# Patient Record
Sex: Female | Born: 1960 | Race: White | Hispanic: No | Marital: Married | Smoking: Former smoker
Health system: Southern US, Community
[De-identification: ages and names within clinical notes are randomized; demographics above are authoritative.]

## PROBLEM LIST (undated history)

## (undated) HISTORY — PX: CHOLECYSTECTOMY: SHX55

## (undated) HISTORY — PX: BREAST CYST EXCISION: SHX579

## (undated) HISTORY — DX: Hemochromatosis, unspecified: E83.119

## (undated) HISTORY — PX: TUBAL LIGATION: SHX77

## (undated) HISTORY — PX: ABDOMINAL HYSTERECTOMY: SHX81

---

## 2000-05-17 ENCOUNTER — Ambulatory Visit (HOSPITAL_COMMUNITY): Admission: RE | Admit: 2000-05-17 | Discharge: 2000-05-17 | Payer: Self-pay | Admitting: Gynecology

## 2000-05-17 ENCOUNTER — Encounter: Payer: Self-pay | Admitting: Gynecology

## 2001-05-02 ENCOUNTER — Other Ambulatory Visit: Admission: RE | Admit: 2001-05-02 | Discharge: 2001-05-02 | Payer: Self-pay | Admitting: Gynecology

## 2001-05-29 ENCOUNTER — Encounter: Payer: Self-pay | Admitting: Gynecology

## 2001-06-05 ENCOUNTER — Inpatient Hospital Stay (HOSPITAL_COMMUNITY): Admission: RE | Admit: 2001-06-05 | Discharge: 2001-06-07 | Payer: Self-pay | Admitting: Gynecology

## 2001-06-05 ENCOUNTER — Encounter (INDEPENDENT_AMBULATORY_CARE_PROVIDER_SITE_OTHER): Payer: Self-pay

## 2002-05-05 ENCOUNTER — Other Ambulatory Visit: Admission: RE | Admit: 2002-05-05 | Discharge: 2002-05-05 | Payer: Self-pay | Admitting: Gynecology

## 2003-05-06 ENCOUNTER — Encounter: Admission: RE | Admit: 2003-05-06 | Discharge: 2003-05-06 | Payer: Self-pay | Admitting: Gynecology

## 2003-05-06 ENCOUNTER — Encounter: Payer: Self-pay | Admitting: Gynecology

## 2003-05-11 ENCOUNTER — Other Ambulatory Visit: Admission: RE | Admit: 2003-05-11 | Discharge: 2003-05-11 | Payer: Self-pay | Admitting: Gynecology

## 2004-05-08 ENCOUNTER — Encounter: Admission: RE | Admit: 2004-05-08 | Discharge: 2004-05-08 | Payer: Self-pay | Admitting: Gynecology

## 2004-05-15 ENCOUNTER — Other Ambulatory Visit: Admission: RE | Admit: 2004-05-15 | Discharge: 2004-05-15 | Payer: Self-pay | Admitting: Gynecology

## 2005-05-15 ENCOUNTER — Encounter: Admission: RE | Admit: 2005-05-15 | Discharge: 2005-05-15 | Payer: Self-pay | Admitting: Gynecology

## 2005-05-22 ENCOUNTER — Other Ambulatory Visit: Admission: RE | Admit: 2005-05-22 | Discharge: 2005-05-22 | Payer: Self-pay | Admitting: Gynecology

## 2006-05-16 ENCOUNTER — Encounter: Admission: RE | Admit: 2006-05-16 | Discharge: 2006-05-16 | Payer: Self-pay | Admitting: Gynecology

## 2006-05-27 ENCOUNTER — Other Ambulatory Visit: Admission: RE | Admit: 2006-05-27 | Discharge: 2006-05-27 | Payer: Self-pay | Admitting: Gynecology

## 2007-05-22 ENCOUNTER — Encounter: Admission: RE | Admit: 2007-05-22 | Discharge: 2007-05-22 | Payer: Self-pay | Admitting: Gynecology

## 2007-06-02 ENCOUNTER — Other Ambulatory Visit: Admission: RE | Admit: 2007-06-02 | Discharge: 2007-06-02 | Payer: Self-pay | Admitting: Gynecology

## 2008-05-25 ENCOUNTER — Encounter: Admission: RE | Admit: 2008-05-25 | Discharge: 2008-05-25 | Payer: Self-pay | Admitting: Gynecology

## 2009-05-27 ENCOUNTER — Encounter: Admission: RE | Admit: 2009-05-27 | Discharge: 2009-05-27 | Payer: Self-pay | Admitting: Gynecology

## 2010-05-29 ENCOUNTER — Encounter: Admission: RE | Admit: 2010-05-29 | Discharge: 2010-05-29 | Payer: Self-pay | Admitting: Gynecology

## 2010-10-09 ENCOUNTER — Encounter: Payer: Self-pay | Admitting: Gynecology

## 2011-02-02 NOTE — Discharge Summary (Signed)
[facility]  Patient:    Sabrina Ho, Sabrina Ho Visit Number: [id number] MRN: [id number]          Service Type: Attending:  Gretta Cool, M.D. Dictated by:   Jeani Sow, F.N.P. Adm. Date:  06/05/01 Disc. Date: 06/07/01   CC:         [facility]   Discharge Summary  HISTORY OF PRESENT ILLNESS:  [name] is a 50 year old, white, married female, G2, P2 who had a cesarean section with her first child and a subsequent vaginal delivery with difficult manipulation of that delivery.  She has severe pelvic support problems since that time with evidence of levator plate injury bilaterally with defecatory dysfunction and an enormous enterocele and rectocele.  It is also noted that she has moderate cystocele and minor stress urinary incontinence.  It appears that her most significant problem is posterior vaginal wall injury.  She has uterine descensus to the introitus with straining and discomfort with intercourse.  She is now admitted for vaginal hysterectomy, vaginal vault suspension, posterior and enterocele repairs.  PHYSICAL EXAMINATION:  CHEST:  Clear to A&P.  HEART:  Rate and rhythm regular without murmur, gallop or cardiac enlargement.  ABDOMEN:  Soft and scaphoid without masses or organomegaly.  PELVIC:  External genitalia within normal limits for female.  Vagina clean and rugae.  Cervix descends to the introitus with minimal traction.  There is an an enormous transverse perirectal fascial defect with detachment of the fascia from the cervix and separation all the way down to the lower third of the vagina.  There is noted an enormous enterocele and rectocele.  She has bilateral levator plate injury and severe detachment.  The anal sphincter remains reasonably intact.  Perineal body is hypermobile.  Rectovaginal exam confirms.  IMPRESSION: 1. Severe pelvic support problems with grade 3 rectocele, enterocele and    grade 3 uterine  prolapse. 2. Smoker with one pack a day. 3. History of cesarean section. 4. History of vaginal birth after cesarean section.  PLAN:  Vaginal hysterectomy, posterior enterocele repairs, cardinal-uterosacral colposuspension under general anesthesia.  LABORATORY DATA AND X-RAY FINDINGS:  Admission hemoglobin 14.9, hematocrit 42.2.  Chest x-ray with no active disease.  HOSPITAL COURSE:  The patient underwent the above-named procedures without any complications and was returned to the recovery room in excellent condition. Her postoperative course was without complications and she was discharged on postop day #2, in excellent condition.  ACTIVITY:  No heavy lifting or straining.  No vaginal entrance.  Increase ambulation as tolerated.  SPECIAL INSTRUCTIONS:  She is to call for any fever of over 100.5 or failure of daily improvement.  DIET:  Regular.  DISCHARGE MEDICATIONS: 1. Vioxx 25 mg one p.o. daily. 2. Tylox one p.o. q.2-4h. p.r.n. discomfort.  FOLLOWUP:  She is to return to the office in one week for followup.  CONDITION ON DISCHARGE:  Excellent.  DISCHARGE DIAGNOSES: 1. Pelvic organ prolapse with a grade 3 uterine prolapse, rectocele and    enterocele. 2. Defecatory dysfunction secondary to rectocele and enterocele.  PROCEDURES: 1. Vaginal hysterectomy. 2. Postoperative colporrhaphy. 3. Enterocele repair. 4. Cardinal-uterosacral colposuspension. 5. Suprapubic cystocath. Dictated by:   Jeani Sow, F.N.P. Attending:  Gretta Cool, M.D. DD:  07/07/01 TD:  07/08/01 Job: [id number]

## 2011-02-02 NOTE — Op Note (Signed)
[facility]  Patient:    Sabrina Ho, Sabrina Ho                         MRN: [id number] Attending:  Gretta Cool, M.D.                           Operative Report  PREOPERATIVE DIAGNOSIS:  Desires sterilization.  POSTOPERATIVE DIAGNOSIS:  Desires sterilization.  PROCEDURE:  Filshie clip sterilization.  SURGEON:  Gretta Cool, M.D.  ANESTHESIA:  MAC.  DESCRIPTION OF PROCEDURE:  Under excellent IV sedation with local infiltration with Marcaine 0.5%, a subumbilical incision was made and extended into the subcutaneous tissue.  The Veress needle was then introduced through the peritoneum and into the peritoneal cavity.  Nitrous oxide pneumoperitoneum was then obtained.  The pelvis was then surveyed.  There were no abnormalities identified in the abdomen, abdominal cavity, and the pelvis or of pelvic organs.  Fallopian tubes were traced to the fimbriated end.  They were then grasped with the Filshie clip applier and Filshie clips applied to each tube approximately a centimeter lateral to the uterine insertion in the thinnest segment of fallopian tube.  Careful examination of the application site revealed transluminal occlusion.  At this point, the instruments were removed, gas allowed to escape, and the incision closed with deep suture of 5-0 Vicryl and skin closure of Steri-Strips.  At the end of the procedure, sponge and lap counts were correct and no complications.  The patient returned to the recovery room in excellent condition. DD:  05/17/00 TD:  05/17/00 Job: [id number]

## 2011-02-02 NOTE — Op Note (Signed)
[facility]  Patient:    Sabrina Ho, Sabrina Ho Visit Number: [id number] MRN: [id number]          Service Type: Attending:  Gretta Cool, M.D. Dictated by:   Gretta Cool, M.D. Proc. Date: 06/05/01   CC:         [facility]   Operative Report  PREOPERATIVE DIAGNOSES: 1. Pelvic organ prolapse with grade 3 uterine prolapse, rectocele, and    enterocele. 2. Defecatory dysfunction secondary to rectocele and enterocele.  POSTOPERATIVE DIAGNOSES: 1. Pelvic organ prolapse with grade 3 uterine prolapse, rectocele, and    enterocele. 2. Defecatory dysfunction secondary to rectocele and enterocele.  PROCEDURE:  Vaginal hysterectomy, posterior colporrhaphy, enterocele repair, uterosacral cardinal colposuspension, suprapubic cystocath.  SURGEON:  Gretta Cool, M.D.  ANESTHESIA:  General orotracheal.  ASSISTANT:  Raynald Kemp, M.D.  DESCRIPTION OF PROCEDURE:  Under excellent general anesthesia with the patients abdomen prepped and draped as a sterile field and Allen stirrups, the cervix was grasped with single tooth tenaculum and pulled down through the introitus.  The cervical mucosa was then injected with epinephrine, Xylocaine 1% with 100,000-200,000 epinephrine.  The cervical mucosa was then incised and pushed off the lower uterine segment.  Cul-de-sac was then entered and the self-retaining retractor placed in the cul-de-sac for retraction.  At this point the uterosacral, then cardinal ligaments were progressively clamped, cut, sutured, and tied with 0 Vicryl.  The anterior vesical vaginal plica was then opened and indeed replaced beneath the bladder.  The uterine vessels were then clamped, cut, and sutured, and tied with 0 Vicryl.  The uterus was then inverted and the adnexal pedicles clamped, cut, sutured, and tied with 0 Vicryl.  They were then doubly ligated with a free tie of 0 Vicryl.  The peritoneum was then closed with  a running suture of 0 Monocryl.  A large portion of the enterocele sac was then excised so as to eliminate the enterocele.  The uterosacral ligaments were then plicated to as high as possible, secured to the posterior and anterior vaginal fascia and then to the uterosacral and cardinal ligaments so as to suspend the vaginal cuff angled to the cardinal and uterosacral complex as high as possible.  The 0 Ethibond suture was then tied on each side.  The cuff was then closed laterally with careful attention to close complete an envelope of fascia from anterior vaginal wall to posterior vaginal wall and secure it to the uterosacral cardinal complex.  At this point the attention was turned to the posterior vaginal wall repair.  The mucosa was then incised at the introitus and undermined to the apex of the vaginal cuff.  The mucosa was then reflected from the perirectal fascia.  The huge transverse fascial defect was then identified.  The fascia had separated from the cervix and had descended to the lower third of the vagina.  The fascia was mobilized and then secured by multiple interrupted sutures to the anterior vaginal wall and to the uterosacral cardinal complex so as to complete an envelope of fascia that was sufficient to prevent repeat enterocele formation.  The severe detachment was down both lateral pelvic walls and was then corrected with a running suture of #0 Vicryl.  The mucosa was then trimmed and closed as a subcuticular closure including the upper layers of endopelvic fascia from the apex of the cuff to the introitus.  Perineal body musculature was then reapproximated with interrupted sutures and the skin closed by  subcuticular closure.  At the end of the procedure sponge and lap counts were correct.  There were no complications.  At this point the bladder was filled with lactated ringers and approximately 400 cc, and a banana suprapubic cystic cath was placed.  It was secured  with 0 Ethibond.  At the end of the procedure sponge and lap counts were correct and there were no complications.  The patient returned to the recovery room in excellent condition. Dictated by:   Gretta Cool, M.D. Attending:  Gretta Cool, M.D. DD:  06/05/01 TD:  06/05/01 Job: [id number]

## 2011-02-02 NOTE — H&P (Signed)
[facility]  Patient:    Sabrina Ho, Sabrina Ho Visit Number: [id number] MRN: [id number]          Service Type: GYN Location: 4W [id number] Attending Physician:  Katrina Stack Dictated by:   Gretta Cool, M.D. Admit Date:  06/05/2001   CC:         [facility]   History and Physical  CHIEF COMPLAINT:  Pelvic support problems.  HISTORY OF PRESENT ILLNESS:  [age]-year-old white married G2, P2 with history of cesarean section delivery with her first child in 1991, then a vaginal birth after a C-section with her second by another physician with the extremely difficult manipulated delivery.  She has had severe pelvic support problems since that time with evidence of levator plate injury bilaterally with defecatory dysfunction with enormous enterocele and rectocele; she has moderate cystocele as well and minor stress incontinence but her most significant problem is posterior vaginal wall injury.  She has uterine descensus to the introitus with straining and more discomfort with intercourse.  She is now admitted for vaginal hysterectomy, vaginal vault suspension, posterior and enterocele repairs.  She understands the risks and benefits of the procedure, the anticipated recovery, pain management, etc.  I have answered her questions regarding the procedure itself, recovery, sexuality afterwards, etc.  PAST MEDICAL HISTORY:  Usual childhood diseases without sequelae.  Medical illnesses:  None of consequence.  Accidents/injuries:  None.  ALLERGIES:  None known.  PRESENT MEDICATIONS:  None.  FAMILY HISTORY:  Mother and father both living and well.  No known familial tendency to disease.  HABITS:  Smokes one pack per day.  Denies ethanol.  Denies recreational drugs.  SOCIAL HISTORY:  Patient is an Airline pilot for [employer].  Husband is a Landscape architect for [employer].  Two children, both living at home.  REVIEW OF SYSTEMS:   HEENT:  Denies symptoms.  CARDIORESPIRATORY:  Denies asthma, cough, bronchitis or shortness of breath.  GI/GU:  Denies frequency, urgency, dysuria or change in bowel habits, food intolerance.  PHYSICAL EXAMINATION:  GENERAL:  Well-developed, well-nourished white female.  HEENT:  Pupils equal, react to light and accommodate.  Fundi benign. Oropharynx clear.  NECK:  Supple without masses or thyroid enlargement.  CHEST:  Clear P-to-A.  HEART:  Regular rhythm, without murmur or cardiac enlargement.  BREASTS:  Without mass, nodes, nipple discharge.  ABDOMEN:  Soft, scaphoid, without mass or organomegaly.  PELVIC:  External genitalia:  Normal female.  Vagina clean, rugous.  Cervix descends to the introitus with minimal traction.  She has an enormous transverse perirectal fascial defect with detachment of the fascia from the cervix and separation all the way to the lower third of the vagina.  She has an enormous enterocele and rectocele.  She has bilateral levator plate injury and severe detachment.  Her anal sphincter remains reasonably intact.  Her perineal body is hypermobile.  Rectovaginal exam confirms.  EXTREMITIES:  Negative.  NEUROLOGIC:  Physiologic.  IMPRESSIONS: 1. Severe pelvic support problems with grade 3 rectocele, enterocele and    grade 3 uterine prolapse. 2. Tobacco use, one pack per day. 3. History of cesarean section. 4. History of vaginal birth after a cesarean section.  PLAN:  Vaginal hysterectomy, posterior and enterocele repairs, cardinal-uterosacral colposuspension. Dictated by:   Gretta Cool, M.D. Attending Physician:  Katrina Stack DD:  06/06/01 TD:  06/06/01 Job: [id number]

## 2011-04-24 ENCOUNTER — Other Ambulatory Visit: Payer: Self-pay | Admitting: Gynecology

## 2011-04-24 DIAGNOSIS — Z1231 Encounter for screening mammogram for malignant neoplasm of breast: Secondary | ICD-10-CM

## 2011-05-31 ENCOUNTER — Ambulatory Visit
Admission: RE | Admit: 2011-05-31 | Discharge: 2011-05-31 | Disposition: A | Discharge status: Home / Self Care | Payer: BC Managed Care – PPO | Source: Ambulatory Visit | Attending: Gynecology | Admitting: Gynecology

## 2011-05-31 ENCOUNTER — Other Ambulatory Visit: Payer: Self-pay | Admitting: Gynecology

## 2011-05-31 DIAGNOSIS — Z1231 Encounter for screening mammogram for malignant neoplasm of breast: Secondary | ICD-10-CM

## 2012-05-02 ENCOUNTER — Other Ambulatory Visit: Payer: Self-pay | Admitting: Gynecology

## 2012-05-02 DIAGNOSIS — Z1231 Encounter for screening mammogram for malignant neoplasm of breast: Secondary | ICD-10-CM

## 2012-06-02 ENCOUNTER — Ambulatory Visit: Payer: BC Managed Care – PPO

## 2012-06-05 ENCOUNTER — Ambulatory Visit
Admission: RE | Admit: 2012-06-05 | Discharge: 2012-06-05 | Disposition: A | Discharge status: Home / Self Care | Payer: BC Managed Care – PPO | Source: Ambulatory Visit | Attending: Gynecology | Admitting: Gynecology

## 2012-06-05 DIAGNOSIS — Z1231 Encounter for screening mammogram for malignant neoplasm of breast: Secondary | ICD-10-CM

## 2012-09-07 ENCOUNTER — Ambulatory Visit (INDEPENDENT_AMBULATORY_CARE_PROVIDER_SITE_OTHER): Payer: BC Managed Care – PPO | Admitting: Family Medicine

## 2012-09-07 VITALS — BP 118/73 | HR 88 | Temp 97.8°F | Resp 18 | Ht 64.0 in | Wt 130.6 lb

## 2012-09-07 DIAGNOSIS — R059 Cough, unspecified: Secondary | ICD-10-CM

## 2012-09-07 DIAGNOSIS — J029 Acute pharyngitis, unspecified: Secondary | ICD-10-CM

## 2012-09-07 DIAGNOSIS — R05 Cough: Secondary | ICD-10-CM

## 2012-09-07 LAB — POCT RAPID STREP A (OFFICE): Rapid Strep A Screen: NEGATIVE

## 2012-09-07 MED ORDER — CEFDINIR 300 MG PO CAPS: 300.0000 mg | ORAL_CAPSULE | Freq: Two times a day (BID) | ORAL | Status: DC

## 2012-09-07 NOTE — Patient Instructions (Signed)

## 2012-09-07 NOTE — Progress Notes (Signed)
Patient ID: Sabrina Ho MRN: [id number], DOB: 1960/09/21, 51 y.o. Date of Encounter: 09/07/2012, 11:49 AM  Primary Physician: No primary provider on file.  Chief Complaint:  Chief Complaint  Patient presents with  . Sore Throat  . Headache    HPI: 51 y.o. year old female presents with 3 day history of sore throat. Subjective fever and chills. No  congestion, rhinorrhea, sinus pressure, otalgia, or headache. Normal hearing. No GI complaints. Able to swallow saliva, but hurts to do so. Decreased appetite secondary to sore throat.  Complains of achiness, too. She notes having a dry cough for about a month. History reviewed. No pertinent past medical history.   Home Meds: Prior to Admission medications   Medication Sig Start Date End Date Taking? Authorizing Provider  Cholecalciferol (VITAMIN D PO) Take 1 capsule by mouth daily.   Yes Historical Provider, MD  Estradiol (MINIVELLE TD) Place 1 patch onto the skin 2 (two) times a week.   Yes Historical Provider, MD    Allergies:  Allergies  Allergen Reactions  . Amoxicillin Rash    History   Social History  . Marital Status: Married    Spouse Name: N/A    Number of Children: N/A  . Years of Education: N/A   Occupational History  . Not on file.   Social History Main Topics  . Smoking status: Never Smoker   . Smokeless tobacco: Not on file  . Alcohol Use: No  . Drug Use: No  . Sexually Active: Yes   Other Topics Concern  . Not on file   Social History Narrative  . No narrative on file     Review of Systems: Constitutional: negative for chills, fever, night sweats or weight changes HEENT: see above Cardiovascular: negative for chest pain or palpitations Respiratory: negative for hemoptysis, wheezing, or shortness of breath Abdominal: negative for abdominal pain, nausea, vomiting or diarrhea Dermatological: negative for rash Neurologic: negative for headache   Physical Exam: Blood pressure 118/73,  pulse 88, temperature 97.8 F (36.6 C), temperature source Oral, resp. rate 18, height 5\' 4"  (1.626 m), weight 130 lb 9.6 oz (59.24 kg), SpO2 98.00%., Body mass index is 22.42 kg/(m^2). General: Well developed, well nourished, in no acute distress. Head: Normocephalic, atraumatic, eyes without discharge, sclera non-icteric, nares are patent. Bilateral auditory canals clear, TM's are without perforation, pearly grey with reflective cone of light bilaterally. No sinus TTP. Oral cavity moist, dentition normal. Posterior pharynx with post nasal drip and mild erythema. No peritonsillar abscess or tonsillar exudate. Neck: Supple. No thyromegaly. Full ROM. No lymphadenopathy. Lungs: Clear bilaterally to auscultation without wheezes, rales, or rhonchi. Breathing is unlabored. Heart: RRR with S1 S2. No murmurs, rubs, or gallops appreciated. Abdomen: Soft, non-tender, non-distended with normoactive bowel sounds. No hepatomegaly. No rebound/guarding. No obvious abdominal masses. Msk:  Strength and tone normal for age. Extremities: No clubbing or cyanosis. No edema. Neuro: Alert and oriented X 3. Moves all extremities spontaneously. CNII-XII grossly in tact. Psych:  Responds to questions appropriately with a normal affect.   Labs: Results for orders placed in visit on 09/07/12  POCT RAPID STREP A (OFFICE)      Component Value Range   Rapid Strep A Screen Negative  Negative     ASSESSMENT AND PLAN:  51 y.o. year old female with sore throat 1. Pharyngitis  POCT rapid strep A, cefdinir (OMNICEF) 300 MG capsule  2. Cough      - -Tylenol/Motrin prn -Rest/fluids -RTC  precautions -RTC 3-5 days if no improvement  Signed, Elvina Sidle, MD 09/07/2012 11:49 AM

## 2013-05-06 ENCOUNTER — Other Ambulatory Visit: Payer: Self-pay

## 2013-05-06 DIAGNOSIS — Z1231 Encounter for screening mammogram for malignant neoplasm of breast: Secondary | ICD-10-CM

## 2013-06-08 ENCOUNTER — Ambulatory Visit
Admission: RE | Admit: 2013-06-08 | Discharge: 2013-06-08 | Disposition: A | Discharge status: Home / Self Care | Payer: BC Managed Care – PPO | Source: Ambulatory Visit

## 2013-06-08 DIAGNOSIS — Z1231 Encounter for screening mammogram for malignant neoplasm of breast: Secondary | ICD-10-CM

## 2014-05-13 ENCOUNTER — Other Ambulatory Visit: Payer: Self-pay

## 2014-05-13 DIAGNOSIS — Z1231 Encounter for screening mammogram for malignant neoplasm of breast: Secondary | ICD-10-CM

## 2014-06-09 ENCOUNTER — Ambulatory Visit
Admission: RE | Admit: 2014-06-09 | Discharge: 2014-06-09 | Disposition: A | Discharge status: Home / Self Care | Payer: BC Managed Care – PPO | Source: Ambulatory Visit

## 2014-06-09 DIAGNOSIS — Z1231 Encounter for screening mammogram for malignant neoplasm of breast: Secondary | ICD-10-CM

## 2014-10-11 LAB — HM COLONOSCOPY

## 2015-05-09 ENCOUNTER — Other Ambulatory Visit: Payer: Self-pay

## 2015-05-09 DIAGNOSIS — Z1231 Encounter for screening mammogram for malignant neoplasm of breast: Secondary | ICD-10-CM

## 2015-06-14 ENCOUNTER — Ambulatory Visit
Admission: RE | Admit: 2015-06-14 | Discharge: 2015-06-14 | Disposition: A | Discharge status: Home / Self Care | Payer: BLUE CROSS/BLUE SHIELD | Source: Ambulatory Visit

## 2015-06-14 DIAGNOSIS — Z1231 Encounter for screening mammogram for malignant neoplasm of breast: Secondary | ICD-10-CM

## 2015-06-17 ENCOUNTER — Other Ambulatory Visit: Payer: Self-pay | Admitting: Gynecology

## 2015-06-17 DIAGNOSIS — R928 Other abnormal and inconclusive findings on diagnostic imaging of breast: Secondary | ICD-10-CM

## 2015-06-27 ENCOUNTER — Ambulatory Visit
Admission: RE | Admit: 2015-06-27 | Discharge: 2015-06-27 | Disposition: A | Discharge status: Home / Self Care | Payer: BLUE CROSS/BLUE SHIELD | Source: Ambulatory Visit | Attending: Gynecology | Admitting: Gynecology

## 2015-06-27 ENCOUNTER — Other Ambulatory Visit: Payer: Self-pay | Admitting: Gynecology

## 2015-06-27 DIAGNOSIS — N6320 Unspecified lump in the left breast, unspecified quadrant: Secondary | ICD-10-CM

## 2015-06-27 DIAGNOSIS — R928 Other abnormal and inconclusive findings on diagnostic imaging of breast: Secondary | ICD-10-CM

## 2015-07-04 ENCOUNTER — Other Ambulatory Visit: Payer: BLUE CROSS/BLUE SHIELD

## 2015-07-05 ENCOUNTER — Other Ambulatory Visit: Payer: Self-pay | Admitting: Gynecology

## 2015-07-05 ENCOUNTER — Ambulatory Visit
Admission: RE | Admit: 2015-07-05 | Discharge: 2015-07-05 | Disposition: A | Discharge status: Home / Self Care | Payer: BLUE CROSS/BLUE SHIELD | Source: Ambulatory Visit | Attending: Gynecology | Admitting: Gynecology

## 2015-07-05 DIAGNOSIS — N6320 Unspecified lump in the left breast, unspecified quadrant: Secondary | ICD-10-CM

## 2015-07-06 ENCOUNTER — Other Ambulatory Visit: Payer: Self-pay | Admitting: Gynecology

## 2015-07-06 ENCOUNTER — Other Ambulatory Visit: Payer: Self-pay | Admitting: General Surgery

## 2015-07-06 DIAGNOSIS — R928 Other abnormal and inconclusive findings on diagnostic imaging of breast: Secondary | ICD-10-CM

## 2015-07-19 ENCOUNTER — Ambulatory Visit
Admission: RE | Admit: 2015-07-19 | Discharge: 2015-07-19 | Disposition: A | Discharge status: Home / Self Care | Payer: BLUE CROSS/BLUE SHIELD | Source: Ambulatory Visit | Attending: Gynecology | Admitting: Gynecology

## 2015-07-19 DIAGNOSIS — R928 Other abnormal and inconclusive findings on diagnostic imaging of breast: Secondary | ICD-10-CM

## 2015-08-01 ENCOUNTER — Other Ambulatory Visit: Payer: Self-pay | Admitting: General Surgery

## 2015-08-01 DIAGNOSIS — R928 Other abnormal and inconclusive findings on diagnostic imaging of breast: Secondary | ICD-10-CM

## 2015-08-02 ENCOUNTER — Other Ambulatory Visit: Payer: Self-pay | Admitting: General Surgery

## 2015-08-02 DIAGNOSIS — D242 Benign neoplasm of left breast: Secondary | ICD-10-CM

## 2015-08-03 ENCOUNTER — Encounter (HOSPITAL_BASED_OUTPATIENT_CLINIC_OR_DEPARTMENT_OTHER): Payer: Self-pay | Admitting: *Deleted

## 2015-08-10 ENCOUNTER — Ambulatory Visit
Admission: RE | Admit: 2015-08-10 | Discharge: 2015-08-10 | Disposition: A | Discharge status: Home / Self Care | Payer: BLUE CROSS/BLUE SHIELD | Source: Ambulatory Visit | Attending: General Surgery | Admitting: General Surgery

## 2015-08-10 DIAGNOSIS — R928 Other abnormal and inconclusive findings on diagnostic imaging of breast: Secondary | ICD-10-CM

## 2015-08-15 ENCOUNTER — Ambulatory Visit
Admission: RE | Admit: 2015-08-15 | Discharge: 2015-08-15 | Disposition: A | Discharge status: Home / Self Care | Payer: BLUE CROSS/BLUE SHIELD | Source: Ambulatory Visit | Attending: General Surgery | Admitting: General Surgery

## 2015-08-15 ENCOUNTER — Encounter (HOSPITAL_BASED_OUTPATIENT_CLINIC_OR_DEPARTMENT_OTHER): Payer: Self-pay | Admitting: Certified Registered"

## 2015-08-15 ENCOUNTER — Ambulatory Visit (HOSPITAL_BASED_OUTPATIENT_CLINIC_OR_DEPARTMENT_OTHER): Payer: BLUE CROSS/BLUE SHIELD | Admitting: Certified Registered"

## 2015-08-15 ENCOUNTER — Encounter (HOSPITAL_BASED_OUTPATIENT_CLINIC_OR_DEPARTMENT_OTHER)
Admission: RE | Disposition: A | Discharge status: Home / Self Care | Payer: Self-pay | Source: Ambulatory Visit | Attending: General Surgery

## 2015-08-15 ENCOUNTER — Ambulatory Visit (HOSPITAL_BASED_OUTPATIENT_CLINIC_OR_DEPARTMENT_OTHER)
Admission: RE | Admit: 2015-08-15 | Discharge: 2015-08-15 | Disposition: A | Discharge status: Home / Self Care | Payer: BLUE CROSS/BLUE SHIELD | Source: Ambulatory Visit | Attending: General Surgery | Admitting: General Surgery

## 2015-08-15 DIAGNOSIS — Z87891 Personal history of nicotine dependence: Secondary | ICD-10-CM | POA: Diagnosis not present

## 2015-08-15 DIAGNOSIS — Z9049 Acquired absence of other specified parts of digestive tract: Secondary | ICD-10-CM | POA: Diagnosis not present

## 2015-08-15 DIAGNOSIS — R928 Other abnormal and inconclusive findings on diagnostic imaging of breast: Secondary | ICD-10-CM

## 2015-08-15 DIAGNOSIS — N62 Hypertrophy of breast: Secondary | ICD-10-CM | POA: Insufficient documentation

## 2015-08-15 DIAGNOSIS — N6092 Unspecified benign mammary dysplasia of left breast: Secondary | ICD-10-CM | POA: Diagnosis not present

## 2015-08-15 DIAGNOSIS — Z881 Allergy status to other antibiotic agents status: Secondary | ICD-10-CM | POA: Insufficient documentation

## 2015-08-15 DIAGNOSIS — D242 Benign neoplasm of left breast: Secondary | ICD-10-CM | POA: Diagnosis present

## 2015-08-15 HISTORY — PX: BREAST LUMPECTOMY WITH RADIOACTIVE SEED LOCALIZATION: SHX6424

## 2015-08-15 SURGERY — BREAST LUMPECTOMY WITH RADIOACTIVE SEED LOCALIZATION
Anesthesia: General | Site: Breast | Laterality: Left

## 2015-08-15 MED ORDER — MIDAZOLAM HCL 2 MG/2ML IJ SOLN
1.0000 mg | INTRAMUSCULAR | Status: DC | PRN
  Administered 2015-08-15: 2 mg via INTRAVENOUS

## 2015-08-15 MED ORDER — MIDAZOLAM HCL 2 MG/2ML IJ SOLN
INTRAMUSCULAR | Status: AC
  Filled 2015-08-15: qty 2

## 2015-08-15 MED ORDER — OXYCODONE-ACETAMINOPHEN 5-325 MG PO TABS: 1.0000 | ORAL_TABLET | ORAL | Status: DC | PRN

## 2015-08-15 MED ORDER — GLYCOPYRROLATE 0.2 MG/ML IJ SOLN: 0.2000 mg | Freq: Once | INTRAMUSCULAR | Status: DC | PRN

## 2015-08-15 MED ORDER — FENTANYL CITRATE (PF) 100 MCG/2ML IJ SOLN
50.0000 ug | INTRAMUSCULAR | Status: AC | PRN
  Administered 2015-08-15: 25 ug via INTRAVENOUS
  Administered 2015-08-15: 50 ug via INTRAVENOUS
  Administered 2015-08-15: 25 ug via INTRAVENOUS

## 2015-08-15 MED ORDER — SCOPOLAMINE 1 MG/3DAYS TD PT72: 1.0000 | MEDICATED_PATCH | Freq: Once | TRANSDERMAL | Status: DC | PRN

## 2015-08-15 MED ORDER — OXYCODONE HCL 5 MG PO TABS
ORAL_TABLET | ORAL | Status: AC
  Filled 2015-08-15: qty 1

## 2015-08-15 MED ORDER — DEXAMETHASONE SODIUM PHOSPHATE 4 MG/ML IJ SOLN
INTRAMUSCULAR | Status: DC | PRN
  Administered 2015-08-15: 10 mg via INTRAVENOUS

## 2015-08-15 MED ORDER — DEXAMETHASONE SODIUM PHOSPHATE 10 MG/ML IJ SOLN
INTRAMUSCULAR | Status: AC
  Filled 2015-08-15: qty 1

## 2015-08-15 MED ORDER — BUPIVACAINE HCL (PF) 0.25 % IJ SOLN
INTRAMUSCULAR | Status: DC | PRN
  Administered 2015-08-15: 16 mL

## 2015-08-15 MED ORDER — VANCOMYCIN HCL IN DEXTROSE 1-5 GM/200ML-% IV SOLN
1000.0000 mg | INTRAVENOUS | Status: AC
  Administered 2015-08-15: 750 mg via INTRAVENOUS
  Administered 2015-08-15: 1000 mg via INTRAVENOUS

## 2015-08-15 MED ORDER — ONDANSETRON HCL 4 MG/2ML IJ SOLN
INTRAMUSCULAR | Status: DC | PRN
  Administered 2015-08-15: 4 mg via INTRAVENOUS

## 2015-08-15 MED ORDER — PROMETHAZINE HCL 25 MG/ML IJ SOLN: 6.2500 mg | INTRAMUSCULAR | Status: DC | PRN

## 2015-08-15 MED ORDER — LIDOCAINE HCL (CARDIAC) 20 MG/ML IV SOLN
INTRAVENOUS | Status: AC
  Filled 2015-08-15: qty 5

## 2015-08-15 MED ORDER — VANCOMYCIN HCL IN DEXTROSE 1-5 GM/200ML-% IV SOLN
INTRAVENOUS | Status: AC
Start: 2015-08-15 — End: 2015-08-15
  Filled 2015-08-15: qty 200

## 2015-08-15 MED ORDER — PROPOFOL 10 MG/ML IV BOLUS
INTRAVENOUS | Status: DC | PRN
  Administered 2015-08-15: 150 mg via INTRAVENOUS

## 2015-08-15 MED ORDER — FENTANYL CITRATE (PF) 100 MCG/2ML IJ SOLN
25.0000 ug | INTRAMUSCULAR | Status: DC | PRN
  Administered 2015-08-15 (×3): 50 ug via INTRAVENOUS

## 2015-08-15 MED ORDER — ONDANSETRON HCL 4 MG/2ML IJ SOLN
INTRAMUSCULAR | Status: AC
  Filled 2015-08-15: qty 2

## 2015-08-15 MED ORDER — CHLORHEXIDINE GLUCONATE 4 % EX LIQD: 1.0000 "application " | Freq: Once | CUTANEOUS | Status: DC

## 2015-08-15 MED ORDER — OXYCODONE HCL 5 MG PO TABS
5.0000 mg | ORAL_TABLET | Freq: Once | ORAL | Status: AC
  Administered 2015-08-15: 5 mg via ORAL

## 2015-08-15 MED ORDER — FENTANYL CITRATE (PF) 100 MCG/2ML IJ SOLN
INTRAMUSCULAR | Status: AC
  Filled 2015-08-15: qty 2

## 2015-08-15 MED ORDER — LACTATED RINGERS IV SOLN
INTRAVENOUS | Status: DC
  Administered 2015-08-15 (×2): via INTRAVENOUS

## 2015-08-15 MED ORDER — LIDOCAINE HCL (CARDIAC) 20 MG/ML IV SOLN
INTRAVENOUS | Status: DC | PRN
  Administered 2015-08-15: 60 mg via INTRAVENOUS

## 2015-08-15 SURGICAL SUPPLY — 42 items
APPLIER CLIP 9.375 MED OPEN (MISCELLANEOUS)
APR CLP MED 9.3 20 MLT OPN (MISCELLANEOUS)
BLADE SURG 15 STRL LF DISP TIS (BLADE) ×1 IMPLANT
BLADE SURG 15 STRL SS (BLADE) ×3
CANISTER SUC SOCK COL 7IN (MISCELLANEOUS) ×1 IMPLANT
CANISTER SUCT 1200ML W/VALVE (MISCELLANEOUS) ×3 IMPLANT
CHLORAPREP W/TINT 26ML (MISCELLANEOUS) ×3 IMPLANT
CLIP APPLIE 9.375 MED OPEN (MISCELLANEOUS) IMPLANT
COVER BACK TABLE 60X90IN (DRAPES) ×3 IMPLANT
COVER MAYO STAND STRL (DRAPES) ×3 IMPLANT
COVER PROBE W GEL 5X96 (DRAPES) ×3 IMPLANT
DECANTER SPIKE VIAL GLASS SM (MISCELLANEOUS) IMPLANT
DEVICE DUBIN W/COMP PLATE 8390 (MISCELLANEOUS) ×3 IMPLANT
DRAPE LAPAROSCOPIC ABDOMINAL (DRAPES) IMPLANT
DRAPE UTILITY XL STRL (DRAPES) ×3 IMPLANT
ELECT COATED BLADE 2.86 ST (ELECTRODE) ×3 IMPLANT
ELECT REM PT RETURN 9FT ADLT (ELECTROSURGICAL) ×3
ELECTRODE REM PT RTRN 9FT ADLT (ELECTROSURGICAL) ×1 IMPLANT
GLOVE BIO SURGEON STRL SZ 6.5 (GLOVE) ×1 IMPLANT
GLOVE BIO SURGEON STRL SZ7.5 (GLOVE) ×6 IMPLANT
GLOVE BIO SURGEONS STRL SZ 6.5 (GLOVE) ×1
GLOVE BIOGEL M 7.0 STRL (GLOVE) ×4 IMPLANT
GOWN STRL REUS W/ TWL LRG LVL3 (GOWN DISPOSABLE) ×2 IMPLANT
GOWN STRL REUS W/TWL LRG LVL3 (GOWN DISPOSABLE) ×6
KIT MARKER MARGIN INK (KITS) ×3 IMPLANT
LIQUID BAND (GAUZE/BANDAGES/DRESSINGS) ×3 IMPLANT
NDL HYPO 25X1 1.5 SAFETY (NEEDLE) IMPLANT
NEEDLE HYPO 25X1 1.5 SAFETY (NEEDLE) ×3 IMPLANT
NS IRRIG 1000ML POUR BTL (IV SOLUTION) IMPLANT
PACK BASIN DAY SURGERY FS (CUSTOM PROCEDURE TRAY) ×3 IMPLANT
PENCIL BUTTON HOLSTER BLD 10FT (ELECTRODE) ×3 IMPLANT
SLEEVE SCD COMPRESS KNEE MED (MISCELLANEOUS) ×3 IMPLANT
SPONGE LAP 18X18 X RAY DECT (DISPOSABLE) ×3 IMPLANT
SUT MON AB 4-0 PC3 18 (SUTURE) IMPLANT
SUT SILK 2 0 SH (SUTURE) IMPLANT
SUT VICRYL 3-0 CR8 SH (SUTURE) ×3 IMPLANT
SYR CONTROL 10ML LL (SYRINGE) IMPLANT
TOWEL OR 17X24 6PK STRL BLUE (TOWEL DISPOSABLE) ×3 IMPLANT
TOWEL OR NON WOVEN STRL DISP B (DISPOSABLE) ×3 IMPLANT
TUBE CONNECTING 20'X1/4 (TUBING)
TUBE CONNECTING 20X1/4 (TUBING) ×1 IMPLANT
YANKAUER SUCT BULB TIP NO VENT (SUCTIONS) IMPLANT

## 2015-08-15 NOTE — Discharge Instructions (Signed)

## 2015-08-15 NOTE — Anesthesia Postprocedure Evaluation (Signed)
Anesthesia Post Note  Patient: Sabrina Ho  Procedure(s) Performed: Procedure(s) (LRB): BREAST LUMPECTOMY WITH RADIOACTIVE SEED LOCALIZATION (Left)  Patient location during evaluation: PACU Anesthesia Type: General Level of consciousness: awake and alert Pain management: pain level controlled Vital Signs Assessment: post-procedure vital signs reviewed and stable Respiratory status: spontaneous breathing, nonlabored ventilation, respiratory function stable and patient connected to nasal cannula oxygen Cardiovascular status: blood pressure returned to baseline and stable Postop Assessment: No signs of nausea or vomiting Anesthetic complications: no    Last Vitals:  Filed Vitals:   08/15/15 1515 08/15/15 1530  BP: 117/63 128/75  Pulse: 69 75  Temp:  36.5 C  Resp: 12 16    Last Pain:  Filed Vitals:   08/15/15 1543  PainSc: 3                  Catalina Gravel

## 2015-08-15 NOTE — Anesthesia Procedure Notes (Signed)
Procedure Name: LMA Insertion Date/Time: 08/15/2015 1:36 PM Performed by: Andria Head D Pre-anesthesia Checklist: Patient identified, Emergency Drugs available, Suction available and Patient being monitored Patient Re-evaluated:Patient Re-evaluated prior to inductionOxygen Delivery Method: Circle System Utilized Preoxygenation: Pre-oxygenation with 100% oxygen Intubation Type: IV induction Ventilation: Mask ventilation without difficulty LMA: LMA inserted LMA Size: 4.0 Number of attempts: 1 Airway Equipment and Method: Bite block Placement Confirmation: positive ETCO2 Tube secured with: Tape Dental Injury: Teeth and Oropharynx as per pre-operative assessment

## 2015-08-15 NOTE — Op Note (Signed)
08/15/2015  2:13 PM  PATIENT:  Sabrina Ho  54 y.o. female  PRE-OPERATIVE DIAGNOSIS:  LEFT BREAST INTRADUCTAL PAPILLOMA  POST-OPERATIVE DIAGNOSIS:  LEFT BREAST INTRADUCTAL PAPILLOMA  PROCEDURE:  Procedure(s): BREAST PARTIAL MASTECTOMY WITH RADIOACTIVE SEED LOCALIZATION (Left)  SURGEON:  Surgeon(s) and Role:    * Jovita Kussmaul, MD - Primary  PHYSICIAN ASSISTANT:   ASSISTANTS: none   ANESTHESIA:   general  EBL:  Total I/O In: 1100 [I.V.:1100] Out: -   BLOOD ADMINISTERED:none  DRAINS: none   LOCAL MEDICATIONS USED:  MARCAINE     SPECIMEN:  Source of Specimen:  left breast tissue  DISPOSITION OF SPECIMEN:  PATHOLOGY  COUNTS:  YES  TOURNIQUET:  * No tourniquets in log *  DICTATION: .Dragon Dictation   After informed consent was obtained the patient was brought to the operating room and placed in the supine position on the operating room table. After adequate induction of general anesthesia the patient's left breast was prepped with ChloraPrep, allowed to dry, and draped in usual sterile manner. Previously an I-125 seed was placed in the lower aspect of the left breast to mark an area of an intraductal papilloma. This area was readily identified with the neoprobe. A curvilinear incision was made along the lower edge of the areola of the left breast. This incision was carried through the skin and subcutaneous tissue sharply with the electrocautery. While checking the area of radioactivity frequently with the neoprobe a circular portion of breast tissue was excised sharply around the radioactive seed. Once the specimen was removed it was oriented with the appropriate paint colors. A specimen radiograph showed the clip and seed to be in the center of the specimen. The specimen was then sent to pathology for further evaluation. Hemostasis was achieved using the Bovie electrocautery. The wound was then fulgurated with quarter percent Marcaine and irrigated with saline. The deep  layer of the wound was closed with interrupted 3-0 Vicryl stitches. The skin was then closed with interrupted 4-0 Monocryl subcuticular stitches. Dermabond dressings were applied. The patient tolerated the procedure well. At the end of the case all needle sponge and instrument counts were correct. The patient was then awakened and taken to recovery in stable condition.  PLAN OF CARE: Discharge to home after PACU  PATIENT DISPOSITION:  PACU - hemodynamically stable.   Delay start of Pharmacological VTE agent (>24hrs) due to surgical blood loss or risk of bleeding: not applicable

## 2015-08-15 NOTE — H&P (Signed)
Sabrina Ho 07/19/2015 11:33 AM Location: [facility] Patient #: [id number] DOB: [birth year] Married / Language: Cleophus Molt / Race: White Female   History of Present Illness Sammuel Hines. Marlou Starks MD; 07/20/2015 4:13 PM) The patient is a 54 year old female who presents with a breast mass. We are asked to see the patient in consultation by Dr. Pamelia Hoit to evaluate her for a left breast papilloma. The patient is a 54 yo wf who recently went for a routine mammogram. At that time she was found to have an abnormality in the lower outer quadrant of the left breast. she denies any breast pain or discharge from the nipple. 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   Other Problems Elbert Ewings, CMA; 07/19/2015 11:35 AM) Back Pain Cholelithiasis  Past Surgical History Elbert Ewings, CMA; 07/19/2015 11:35 AM) Breast Biopsy Left. Cesarean Section - 1 Colon Polyp Removal - Colonoscopy Gallbladder Surgery - Laparoscopic Hysterectomy (not due to cancer) - Partial Oral Surgery  Diagnostic Studies History Elbert Ewings, CMA; 07/19/2015 11:35 AM) Colonoscopy within last year Mammogram within last year Pap Smear 1-5 years ago  Allergies Elbert Ewings, CMA; 07/19/2015 11:36 AM) Amoxicillin-Pot Clavulanate *PENICILLINS* Hives.  Medication History Elbert Ewings, CMA; 07/19/2015 11:36 AM) Estradiol (0.1MG /24HR Patch TW, Transdermal) Active.  Social History Elbert Ewings, [place]; 07/19/2015 11:35 AM) Alcohol use Occasional alcohol use. Caffeine use Carbonated beverages, Tea. No drug use Tobacco use Former smoker.  Family History Elbert Ewings, [place]; 07/19/2015 11:35 AM) Colon Polyps Brother, Father, Mother, Sister. Hypertension Father, Mother. Prostate Cancer Father. Rectal Cancer Sister. Thyroid problems Mother.  Pregnancy / Birth History Elbert Ewings, CMA; 07/19/2015 11:35 AM) Age at menarche 63 years. Age of menopause 51-55 Contraceptive History Oral  contraceptives. Gravida 2 Irregular periods Maternal age 34-30 Para 2    Review of Systems Elbert Ewings CMA; 07/19/2015 11:35 AM) General Present- Weight Gain. Not Present- Appetite Loss, Chills, Fatigue, Fever, Night Sweats and Weight Loss. Skin Not Present- Change in Wart/Mole, Dryness, Hives, Jaundice, New Lesions, Non-Healing Wounds, Rash and Ulcer. HEENT Present- Wears glasses/contact lenses. Not Present- Earache, Hearing Loss, Hoarseness, Nose Bleed, Oral Ulcers, Ringing in the Ears, Seasonal Allergies, Sinus Pain, Sore Throat, Visual Disturbances and Yellow Eyes. Respiratory Present- Snoring. Not Present- Bloody sputum, Chronic Cough, Difficulty Breathing and Wheezing. Breast Not Present- Breast Mass, Breast Pain, Nipple Discharge and Skin Changes. Cardiovascular Not Present- Chest Pain, Difficulty Breathing Lying Down, Leg Cramps, Palpitations, Rapid Heart Rate, Shortness of Breath and Swelling of Extremities. Gastrointestinal Not Present- Abdominal Pain, Bloating, Bloody Stool, Change in Bowel Habits, Chronic diarrhea, Constipation, Difficulty Swallowing, Excessive gas, Gets full quickly at meals, Hemorrhoids, Indigestion, Nausea, Rectal Pain and Vomiting. Female Genitourinary Not Present- Frequency, Nocturia, Painful Urination, Pelvic Pain and Urgency. Musculoskeletal Not Present- Back Pain, Joint Pain, Joint Stiffness, Muscle Pain, Muscle Weakness and Swelling of Extremities. Neurological Not Present- Decreased Memory, Fainting, Headaches, Numbness, Seizures, Tingling, Tremor, Trouble walking and Weakness. Psychiatric Not Present- Anxiety, Bipolar, Change in Sleep Pattern, Depression, Fearful and Frequent crying. Endocrine Present- Hot flashes. Not Present- Cold Intolerance, Excessive Hunger, Hair Changes, Heat Intolerance and New Diabetes. Hematology Not Present- Easy Bruising, Excessive bleeding, Gland problems, HIV and Persistent Infections.  Vitals Elbert Ewings CMA; 07/19/2015  11:36 AM) 07/19/2015 11:36 AM Temp.: 65F(Temporal)  Pulse: 60 (Regular)  BP: 124/74 (Sitting, Left Arm, Standard)       Physical Exam Eddie Dibbles S. Marlou Starks MD; 07/20/2015 4:14 PM) General Mental Status-Alert. General Appearance-Consistent with stated age. Hydration-Well hydrated. Voice-Normal.  Head and Neck Head-normocephalic, atraumatic with no lesions or palpable masses. Trachea-midline. Thyroid Gland Characteristics - normal size and consistency.  Eye Eyeball - Bilateral-Extraocular movements intact. Sclera/Conjunctiva - Bilateral-No scleral icterus.  Chest and Lung Exam Chest and lung exam reveals -quiet, even and easy respiratory effort with no use of accessory muscles and on auscultation, normal breath sounds, no adventitious sounds and normal vocal resonance. Inspection Chest Wall - Normal. Back - normal.  Breast Note: There is no palpable mass in either breast. There is no palpable axillary, supraclavicular, or cervical lymphadenopathy   Cardiovascular Cardiovascular examination reveals -normal heart sounds, regular rate and rhythm with no murmurs and normal pedal pulses bilaterally.  Abdomen Inspection Inspection of the abdomen reveals - No Hernias. Skin - Scar - no surgical scars. Palpation/Percussion Palpation and Percussion of the abdomen reveal - Soft, Non Tender, No Rebound tenderness, No Rigidity (guarding) and No hepatosplenomegaly. Auscultation Auscultation of the abdomen reveals - Bowel sounds normal.  Neurologic Neurologic evaluation reveals -alert and oriented x 3 with no impairment of recent or remote memory. Mental Status-Normal.  Musculoskeletal Normal Exam - Left-Upper Extremity Strength Normal and Lower Extremity Strength Normal. Normal Exam - Right-Upper Extremity Strength Normal and Lower Extremity Strength Normal.  Lymphatic Head & Neck  General Head & Neck Lymphatics: Bilateral - Description -  Normal. Axillary  General Axillary Region: Bilateral - Description - Normal. Tenderness - Non Tender. Femoral & Inguinal  Generalized Femoral & Inguinal Lymphatics: Bilateral - Description - Normal. Tenderness - Non Tender.    Assessment & Plan Eddie Dibbles S. 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 Because this carries a slightly increased risk of breast cancer and because of its abnormal appearance on mammogram I think it would be reasonable to remove this area. I have discussed with her in detail the risks and benefits of the operation to do this as well as some of the technical aspects and she understands and wishes to proceed. I will plan for a left breast radioactive seed localized lumpectomy    Signed by Luella Cook, MD (07/20/2015 4:15 PM)

## 2015-08-15 NOTE — Transfer of Care (Signed)
Immediate Anesthesia Transfer of Care Note  Patient: Sabrina Ho  Procedure(s) Performed: Procedure(s): BREAST LUMPECTOMY WITH RADIOACTIVE SEED LOCALIZATION (Left)  Patient Location: PACU  Anesthesia Type:General  Level of Consciousness: awake and patient cooperative  Airway & Oxygen Therapy: Patient Spontanous Breathing and Patient connected to face mask oxygen  Post-op Assessment: Report given to RN and Post -op Vital signs reviewed and stable  Post vital signs: Reviewed and stable  Last Vitals:  Filed Vitals:   08/15/15 1158  BP: 136/64  Pulse: 72  Temp: 36.9 C  Resp: 20    Complications: No apparent anesthesia complications

## 2015-08-15 NOTE — Anesthesia Preprocedure Evaluation (Addendum)
Anesthesia Evaluation  Patient identified by MRN, date of birth, ID band Patient awake    Reviewed: Allergy & Precautions, NPO status , Patient's Chart, lab work & pertinent test results  History of Anesthesia Complications Negative for: history of anesthetic complications  Airway Mallampati: II  TM Distance: <3 FB Neck ROM: Full    Dental  (+) Teeth Intact, Dental Advisory Given   Pulmonary former smoker,    Pulmonary exam normal breath sounds clear to auscultation       Cardiovascular Exercise Tolerance: Good (-) hypertension(-) angina(-) CAD, (-) Past MI and (-) CHF negative cardio ROS Normal cardiovascular exam Rhythm:Regular Rate:Normal     Neuro/Psych negative neurological ROS  negative psych ROS   GI/Hepatic negative GI ROS, Neg liver ROS,   Endo/Other  negative endocrine ROS  Renal/GU negative Renal ROS     Musculoskeletal negative musculoskeletal ROS (+)   Abdominal   Peds  Hematology negative hematology ROS (+)   Anesthesia Other Findings Day of surgery medications reviewed with the patient.  Breast Cancer  Reproductive/Obstetrics negative OB ROS                            Anesthesia Physical Anesthesia Plan  ASA: II  Anesthesia Plan: General   Post-op Pain Management:    Induction: Intravenous  Airway Management Planned: LMA  Additional Equipment:   Intra-op Plan:   Post-operative Plan: Extubation in OR  Informed Consent: I have reviewed the patients History and Physical, chart, labs and discussed the procedure including the risks, benefits and alternatives for the proposed anesthesia with the patient or authorized representative who has indicated his/her understanding and acceptance.   Dental advisory given  Plan Discussed with: CRNA  Anesthesia Plan Comments: (Risks/benefits of general anesthesia discussed with patient including risk of damage to teeth,  lips, gum, and tongue, nausea/vomiting, allergic reactions to medications, and the possibility of heart attack, stroke and death.  All patient questions answered.  Patient wishes to proceed.)        Anesthesia Quick Evaluation

## 2015-08-15 NOTE — Interval H&P Note (Signed)
History and Physical Interval Note:  08/15/2015 1:23 PM  Sabrina Ho  has presented today for surgery, with the diagnosis of LEFT BREAST INTRADUCTORY PAPILLOMA  The various methods of treatment have been discussed with the patient and family. After consideration of risks, benefits and other options for treatment, the patient has consented to  Procedure(s): BREAST LUMPECTOMY WITH RADIOACTIVE SEED LOCALIZATION (Left) as a surgical intervention .  The patient's history has been reviewed, patient examined, no change in status, stable for surgery.  I have reviewed the patient's chart and labs.  Questions were answered to the patient's satisfaction.     TOTH III,PAUL S

## 2015-08-16 ENCOUNTER — Encounter (HOSPITAL_BASED_OUTPATIENT_CLINIC_OR_DEPARTMENT_OTHER): Payer: Self-pay | Admitting: General Surgery

## 2016-09-19 ENCOUNTER — Other Ambulatory Visit: Payer: Self-pay | Admitting: Gynecology

## 2016-09-19 DIAGNOSIS — Z1231 Encounter for screening mammogram for malignant neoplasm of breast: Secondary | ICD-10-CM

## 2016-09-25 ENCOUNTER — Ambulatory Visit: Payer: BLUE CROSS/BLUE SHIELD

## 2016-10-09 ENCOUNTER — Ambulatory Visit
Admission: RE | Admit: 2016-10-09 | Discharge: 2016-10-09 | Disposition: A | Discharge status: Home / Self Care | Payer: BLUE CROSS/BLUE SHIELD | Source: Ambulatory Visit | Attending: Gynecology | Admitting: Gynecology

## 2016-10-09 DIAGNOSIS — Z1231 Encounter for screening mammogram for malignant neoplasm of breast: Secondary | ICD-10-CM

## 2016-11-07 DIAGNOSIS — Z01419 Encounter for gynecological examination (general) (routine) without abnormal findings: Secondary | ICD-10-CM | POA: Diagnosis not present

## 2016-11-07 DIAGNOSIS — Z1389 Encounter for screening for other disorder: Secondary | ICD-10-CM | POA: Diagnosis not present

## 2016-11-07 DIAGNOSIS — N951 Menopausal and female climacteric states: Secondary | ICD-10-CM | POA: Diagnosis not present

## 2016-11-07 DIAGNOSIS — Z6824 Body mass index (BMI) 24.0-24.9, adult: Secondary | ICD-10-CM | POA: Diagnosis not present

## 2016-11-07 DIAGNOSIS — Z79890 Hormone replacement therapy (postmenopausal): Secondary | ICD-10-CM | POA: Diagnosis not present

## 2016-11-07 DIAGNOSIS — Z130 Encounter for screening for diseases of the blood and blood-forming organs and certain disorders involving the immune mechanism: Secondary | ICD-10-CM | POA: Diagnosis not present

## 2017-01-22 DIAGNOSIS — H25013 Cortical age-related cataract, bilateral: Secondary | ICD-10-CM | POA: Diagnosis not present

## 2017-02-28 ENCOUNTER — Ambulatory Visit (HOSPITAL_COMMUNITY)
Admission: EM | Admit: 2017-02-28 | Discharge: 2017-02-28 | Disposition: A | Discharge status: Home / Self Care | Payer: BLUE CROSS/BLUE SHIELD | Attending: Internal Medicine | Admitting: Internal Medicine

## 2017-02-28 ENCOUNTER — Encounter (HOSPITAL_COMMUNITY): Payer: Self-pay | Admitting: *Deleted

## 2017-02-28 DIAGNOSIS — R1010 Upper abdominal pain, unspecified: Secondary | ICD-10-CM | POA: Diagnosis not present

## 2017-02-28 DIAGNOSIS — R141 Gas pain: Secondary | ICD-10-CM

## 2017-02-28 DIAGNOSIS — K5901 Slow transit constipation: Secondary | ICD-10-CM | POA: Diagnosis not present

## 2017-02-28 LAB — POCT URINALYSIS DIP (DEVICE)
BILIRUBIN URINE: NEGATIVE
Glucose, UA: NEGATIVE mg/dL
Ketones, ur: NEGATIVE mg/dL
LEUKOCYTES UA: NEGATIVE
NITRITE: NEGATIVE
Protein, ur: NEGATIVE mg/dL
SPECIFIC GRAVITY, URINE: 1.015 (ref 1.005–1.030)
Urobilinogen, UA: 0.2 mg/dL (ref 0.0–1.0)
pH: 6 (ref 5.0–8.0)

## 2017-02-28 NOTE — Discharge Instructions (Signed)
The pain is likely due to abdominal gas pain. Physical exam reveals a large area of gas within the abdomen. There is moderate amount of stool in the left side of the abdomen over the descending colon. Recommend using MiraLAX as directed to empty the colon and the gas. If you develop increased abdominal pain, if it localizes to one area, develop nausea vomiting, fever or chills then he may need to go to the emergency department for evaluation.

## 2017-02-28 NOTE — ED Provider Notes (Signed)
CSN: [id number]     Arrival date & time 02/28/17  1004 History   First MD Initiated Contact with Patient 02/28/17 1043     Chief Complaint  Patient presents with  . Abdominal Pain   (Consider location/radiation/quality/duration/timing/severity/associated sxs/prior Treatment) 56 year old female awoke this morning between 40 5 AM with moderate to severe mid to upper abdominal pain. She states it felt like gas pain in which she has experienced in the past. She states it is a deep achy constant type pain. It is much better now than it was earlier this morning. There was radiation of pain across the mid back. It too is a little better but persists. They are worse no nausea or vomiting. She states the worst of the pain lasted approximately 20-30 minutes for ameliorating. Her usual bowel movements are one approximately every 2-3 days. She did have a loose stool yesterday and she took Imodium. She noted that she had family members with a viral gastroenteritis and thought that she may be suffering from diarrhea due to a virus and this why she took the Imodium.      History reviewed. No pertinent past medical history. Past Surgical History:  Procedure Laterality Date  . ABDOMINAL HYSTERECTOMY    . BREAST LUMPECTOMY WITH RADIOACTIVE SEED LOCALIZATION Left 08/15/2015   Procedure: BREAST LUMPECTOMY WITH RADIOACTIVE SEED LOCALIZATION;  Surgeon: Autumn Messing III, MD;  Location: [facility];  Service: General;  Laterality: Left;  . CESAREAN SECTION    . CHOLECYSTECTOMY    . TUBAL LIGATION     Family History  Problem Relation Age of Onset  . Hypertension Mother    Social History  Substance Use Topics  . Smoking status: Former Research scientist (life sciences)  . Smokeless tobacco: Not on file  . Alcohol use No   OB History    No data available     Review of Systems  Constitutional: Negative for activity change, chills, fatigue and fever.  HENT: Negative.   Respiratory: Negative.  Negative for shortness of  breath.   Cardiovascular: Negative for chest pain, palpitations and leg swelling.  Gastrointestinal: Positive for abdominal pain. Negative for blood in stool, nausea and vomiting.       As per history of present illness  Genitourinary: Negative.   Skin: Negative.   Neurological: Negative.   All other systems reviewed and are negative.   Allergies  Amoxicillin  Home Medications   Prior to Admission medications   Medication Sig Start Date End Date Taking? Authorizing Provider  Estradiol (MINIVELLE TD) Place 1 patch onto the skin 2 (two) times a week.    [provider]  oxyCODONE-acetaminophen (ROXICET) 5-325 MG tablet Take 1-2 tablets by mouth every 4 (four) hours as needed. 08/15/15   Jovita Kussmaul, MD   Meds Ordered and Administered this Visit  Medications - No data to display  BP 132/78 (BP Location: Right Arm)   Pulse 78   Temp 98.6 F (37 C) (Oral)   Resp 18   SpO2 100%  No data found.   Physical Exam  Constitutional: She is oriented to person, place, and time. She appears well-developed and well-nourished. No distress.  Eyes: EOM are normal.  Neck: Normal range of motion. Neck supple.  Cardiovascular: Normal rate, regular rhythm, normal heart sounds and intact distal pulses.   Pulmonary/Chest: Effort normal and breath sounds normal. No respiratory distress. She has no wheezes. She has no rales.  Abdominal: Soft. Bowel sounds are normal. She exhibits no distension.  Abdomen is soft. Minor tenderness in both the left and right lower quadrant. Minor tenderness to the left mid and right mid abdomen. No guarding or rebound. Percussion reveals tympany in the upper quadrants and the right hemiabdomen there is firmness/dullness to the mid descending colon to the area below the umbilicus. Pressing on one side of the abdomen causes discomfort to the other. No guarding. No peritoneal signs.   Musculoskeletal: She exhibits no edema.  Neurological: She is oriented to  person, place, and time.  Skin: Skin is warm and dry.  Psychiatric: She has a normal mood and affect.  Nursing note and vitals reviewed.   Urgent Care Course     Procedures (including critical care time)  Labs Review Labs Reviewed  POCT URINALYSIS DIP (DEVICE) - Abnormal; Notable for the following:       Result Value   Hgb urine dipstick SMALL (*)    All other components within normal limits    Imaging Review No results found.   Visual Acuity Review  Right Eye Distance:   Left Eye Distance:   Bilateral Distance:    Right Eye Near:   Left Eye Near:    Bilateral Near:         MDM   1. Abdominal gas pain   2. Slow transit constipation    The pain is likely due to abdominal gas pain. Physical exam reveals a large area of gas within the abdomen. There is moderate amount of stool in the left side of the abdomen over the descending colon. Recommend using MiraLAX as directed to empty the colon and the gas. If you develop increased abdominal pain, if it localizes to one area, develop nausea vomiting, fever or chills then he may need to go to the emergency department for evaluation.    Janne Napoleon, NP 02/28/17 1113

## 2017-02-28 NOTE — ED Triage Notes (Signed)
Pt  States   She  Was  Awoken     During  The  Night   With  l   Flank pain  Radiating   To  l  Lower    abd      Denies   Any  Nausea   Vomiting

## 2017-06-30 ENCOUNTER — Encounter (HOSPITAL_COMMUNITY): Payer: Self-pay | Admitting: Emergency Medicine

## 2017-06-30 ENCOUNTER — Emergency Department (HOSPITAL_COMMUNITY): Payer: BLUE CROSS/BLUE SHIELD

## 2017-06-30 ENCOUNTER — Emergency Department (HOSPITAL_COMMUNITY)
Admission: EM | Admit: 2017-06-30 | Discharge: 2017-06-30 | Disposition: A | Discharge status: Home / Self Care | Payer: BLUE CROSS/BLUE SHIELD | Attending: Emergency Medicine | Admitting: Emergency Medicine

## 2017-06-30 DIAGNOSIS — Z79899 Other long term (current) drug therapy: Secondary | ICD-10-CM | POA: Diagnosis not present

## 2017-06-30 DIAGNOSIS — R748 Abnormal levels of other serum enzymes: Secondary | ICD-10-CM | POA: Insufficient documentation

## 2017-06-30 DIAGNOSIS — R945 Abnormal results of liver function studies: Secondary | ICD-10-CM | POA: Diagnosis not present

## 2017-06-30 DIAGNOSIS — R109 Unspecified abdominal pain: Secondary | ICD-10-CM | POA: Diagnosis not present

## 2017-06-30 DIAGNOSIS — Z87891 Personal history of nicotine dependence: Secondary | ICD-10-CM | POA: Insufficient documentation

## 2017-06-30 DIAGNOSIS — R112 Nausea with vomiting, unspecified: Secondary | ICD-10-CM | POA: Diagnosis not present

## 2017-06-30 DIAGNOSIS — R1010 Upper abdominal pain, unspecified: Secondary | ICD-10-CM | POA: Diagnosis not present

## 2017-06-30 LAB — COMPREHENSIVE METABOLIC PANEL
ALBUMIN: 3.7 g/dL (ref 3.5–5.0)
ALT: 491 U/L — ABNORMAL HIGH (ref 14–54)
AST: 336 U/L — ABNORMAL HIGH (ref 15–41)
Alkaline Phosphatase: 183 U/L — ABNORMAL HIGH (ref 38–126)
Anion gap: 9 (ref 5–15)
BUN: 7 mg/dL (ref 6–20)
CHLORIDE: 104 mmol/L (ref 101–111)
CO2: 24 mmol/L (ref 22–32)
Calcium: 9.1 mg/dL (ref 8.9–10.3)
Creatinine, Ser: 1 mg/dL (ref 0.44–1.00)
GFR calc Af Amer: >60 mL/min (ref >60)
GFR calc non Af Amer: >60 mL/min (ref >60)
GLUCOSE: 158 mg/dL — AB (ref 65–99)
POTASSIUM: 3.4 mmol/L — AB (ref 3.5–5.1)
SODIUM: 137 mmol/L (ref 135–145)
TOTAL PROTEIN: 6.6 g/dL (ref 6.5–8.1)
Total Bilirubin: 2.6 mg/dL — ABNORMAL HIGH (ref 0.3–1.2)

## 2017-06-30 LAB — URINALYSIS, ROUTINE W REFLEX MICROSCOPIC
Bilirubin Urine: NEGATIVE
Glucose, UA: NEGATIVE mg/dL
Ketones, ur: NEGATIVE mg/dL
Leukocytes, UA: NEGATIVE
NITRITE: NEGATIVE
PROTEIN: NEGATIVE mg/dL
Specific Gravity, Urine: 1.012 (ref 1.005–1.030)
pH: 7 (ref 5.0–8.0)

## 2017-06-30 LAB — CBC
HEMATOCRIT: 40.5 % (ref 36.0–46.0)
HEMOGLOBIN: 13.9 g/dL (ref 12.0–15.0)
MCH: 31.9 pg (ref 26.0–34.0)
MCHC: 34.3 g/dL (ref 30.0–36.0)
MCV: 92.9 fL (ref 78.0–100.0)
Platelets: 255 10*3/uL (ref 150–400)
RBC: 4.36 MIL/uL (ref 3.87–5.11)
RDW: 12.4 % (ref 11.5–15.5)
WBC: 10.2 10*3/uL (ref 4.0–10.5)

## 2017-06-30 LAB — I-STAT TROPONIN, ED: TROPONIN I, POC: 0 ng/mL (ref 0.00–0.08)

## 2017-06-30 LAB — LIPASE, BLOOD: LIPASE: 22 U/L (ref 11–51)

## 2017-06-30 MED ORDER — SODIUM CHLORIDE 0.9 % IV BOLUS (SEPSIS)
1000.0000 mL | Freq: Once | INTRAVENOUS | Status: AC
  Administered 2017-06-30: 1000 mL via INTRAVENOUS

## 2017-06-30 MED ORDER — IOPAMIDOL (ISOVUE-300) INJECTION 61%
INTRAVENOUS | Status: AC
  Administered 2017-06-30: 100 mL
  Filled 2017-06-30: qty 100

## 2017-06-30 MED ORDER — ONDANSETRON 4 MG PO TBDP: 4.0000 mg | ORAL_TABLET | Freq: Three times a day (TID) | ORAL | 0 refills | Status: DC | PRN

## 2017-06-30 MED ORDER — TRAMADOL HCL 50 MG PO TABS: 50.0000 mg | ORAL_TABLET | Freq: Four times a day (QID) | ORAL | 0 refills | Status: DC | PRN

## 2017-06-30 NOTE — ED Provider Notes (Signed)
[facility] DEPT Provider Note   CSN: [id number] Arrival date & time: 06/30/17  1559     History   Chief Complaint Chief Complaint  Patient presents with  . Abdominal Pain  . Back Pain    HPI Sabrina Ho is a 56 y.o. female.  HPI  Patient presents with concern of abdominal and back pain. Pain began about one week ago without clear precipitant. Since onset pain has been intermittent, but when present is sore, gnawing, persistent. The pain is focally about across the upper abdomen, and mid back. Patient states the pain is similar to pain she experienced during acute cholecystitis. However, the patient is now status post cholecystectomy. Patient has had some nausea, anorexia, but no vomiting until today. Patient had one episode of vomiting in the waiting room. No fever, no chills, no skin changes, no confusion, disorientation, chest pain, dyspnea.  History reviewed. No pertinent past medical history.  There are no active problems to display for this patient.   Past Surgical History:  Procedure Laterality Date  . ABDOMINAL HYSTERECTOMY    . BREAST LUMPECTOMY WITH RADIOACTIVE SEED LOCALIZATION Left 08/15/2015   Procedure: BREAST LUMPECTOMY WITH RADIOACTIVE SEED LOCALIZATION;  Surgeon: Autumn Messing III, MD;  Location: [facility];  Service: General;  Laterality: Left;  . CESAREAN SECTION    . CHOLECYSTECTOMY    . TUBAL LIGATION      OB History    No data available       Home Medications    Prior to Admission medications   Medication Sig Start Date End Date Taking? Authorizing Provider  Cholecalciferol (VITAMIN D PO) Take 1 tablet by mouth daily.   Yes [provider]  Estradiol (MINIVELLE TD) Place 1 patch onto the skin 2 (two) times a week.   Yes [provider]    Family History Family History  Problem Relation Age of Onset  . Hypertension Mother     Social History Social History  Substance Use Topics  . Smoking  status: Former Research scientist (life sciences)  . Smokeless tobacco: Never Used  . Alcohol use No     Allergies   Amoxicillin   Review of Systems Review of Systems  Constitutional:       Per HPI, otherwise negative  HENT:       Per HPI, otherwise negative  Respiratory:       Per HPI, otherwise negative  Cardiovascular:       Per HPI, otherwise negative  Gastrointestinal: Positive for abdominal pain, nausea and vomiting.  Endocrine:       Negative aside from HPI  Genitourinary:       Neg aside from HPI   Musculoskeletal:       Per HPI, otherwise negative  Skin: Negative.   Neurological: Negative for syncope.     Physical Exam Updated Vital Signs BP 131/65 (BP Location: Right Arm)   Pulse 73   Resp 16   SpO2 96%   Physical Exam  Constitutional: She is oriented to person, place, and time. She appears well-developed and well-nourished. No distress.  HENT:  Head: Normocephalic and atraumatic.  Eyes: Conjunctivae and EOM are normal.  Cardiovascular: Normal rate and regular rhythm.   Pulmonary/Chest: Effort normal and breath sounds normal. No stridor. No respiratory distress.  Abdominal: She exhibits no distension and no mass. There is no tenderness. There is no guarding.  Musculoskeletal: She exhibits no edema.  Neurological: She is alert and oriented to person, place, and time. No cranial  nerve deficit.  Skin: Skin is warm and dry.  Psychiatric: She has a normal mood and affect.  Nursing note and vitals reviewed.    ED Treatments / Results  Labs (all labs ordered are listed, but only abnormal results are displayed) Labs Reviewed  COMPREHENSIVE METABOLIC PANEL - Abnormal; Notable for the following:       Result Value   Potassium 3.4 (*)    Glucose, Bld 158 (*)    AST 336 (*)    ALT 491 (*)    Alkaline Phosphatase 183 (*)    Total Bilirubin 2.6 (*)    All other components within normal limits  URINALYSIS, ROUTINE W REFLEX MICROSCOPIC - Abnormal; Notable for the following:     Color, Urine AMBER (*)    Hgb urine dipstick SMALL (*)    Bacteria, UA RARE (*)    Squamous Epithelial / LPF 0-5 (*)    All other components within normal limits  LIPASE, BLOOD  CBC  I-STAT TROPONIN, ED    EKG  EKG Interpretation  Date/Time:  Sunday June 30 2017 16:07:56 EDT Ventricular Rate:  92 PR Interval:  152 QRS Duration: 72 QT Interval:  354 QTC Calculation: 437 R Axis:   57 Text Interpretation:  Normal sinus rhythm Normal ECG Normal ECG Confirmed by Carmin Muskrat [phone]) on 06/30/2017 6:31:32 PM       Radiology Ct Abdomen Pelvis W Contrast  Result Date: 06/30/2017 CLINICAL DATA:  Do not generalized abdominal pain. EXAM: CT ABDOMEN AND PELVIS WITH CONTRAST TECHNIQUE: Multidetector CT imaging of the abdomen and pelvis was performed using the standard protocol following bolus administration of intravenous contrast. CONTRAST:  89 cc Isovue 300 intravenously. COMPARISON:  None. FINDINGS: Lower chest: No acute abnormality. Hepatobiliary: No focal liver abnormality is seen. Status post cholecystectomy. No biliary dilatation. Pancreas: Unremarkable. No pancreatic ductal dilatation or surrounding inflammatory changes. Spleen: Normal in size without focal abnormality. Adrenals/Urinary Tract: Adrenal glands are unremarkable. Kidneys are normal, without renal calculi, focal lesion, or hydronephrosis. Bladder is unremarkable. Stomach/Bowel: Stomach is within normal limits. Appendix appears normal. No evidence of bowel wall thickening, distention, or inflammatory changes. Vascular/Lymphatic: No significant vascular findings are present. No enlarged abdominal or pelvic lymph nodes. Reproductive: Status post hysterectomy. No adnexal masses. Other: No abdominal wall hernia or abnormality. No abdominopelvic ascites. 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 IMPRESSION: No acute or significant abnormality within the abdomen or pelvis. 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 Electronically Signed   By: Fidela Salisbury M.D.   On: 06/30/2017 19:18    Procedures Procedures (including critical care time)  Medications Ordered in ED Medications  sodium chloride 0.9 % bolus 1,000 mL (1,000 mLs Intravenous New Bag/Given 06/30/17 1843)  iopamidol (ISOVUE-300) 61 % injection (100 mLs  Contrast Given 06/30/17 1858)     Initial Impression / Assessment and Plan / ED Course  I have reviewed the triage vital signs and the nursing notes.  Pertinent labs & imaging results that were available during my care of the patient were reviewed by me and considered in my medical decision making (see chart for details).     With initial blood tests concerning for acute hepatic injury CT scan ordered.   7:59 PM Patient remains awake and alert, no ongoing complaints, no additional vomiting, no ongoing nausea. I discussed all findings again, including CT results with her and her husband. With reassuring CT, though with some suspicion for inflammation of the liver,  the patient will follow-up with our GI doctors. No evidence for peritonitis, bacteremia, sepsis, no ongoing nausea, vomiting or discomfort, patient is appropriate for close outpatient follow-up. Final Clinical Impressions(s) / ED Diagnoses  Abdominal pain Elevated liver enzymes Hyperbilirubinemia   Carmin Muskrat, MD 06/30/17 2000

## 2017-06-30 NOTE — ED Notes (Signed)
Pt understood dc material. NAD noted. Scripts given at dc 

## 2017-06-30 NOTE — Discharge Instructions (Signed)
As discussed, your evaluation today has been largely reassuring.  But, it is important that you monitor your condition carefully, and do not hesitate to return to the ED if you develop new, or concerning changes in your condition.  However, your findings on tonight's evaluation, including evidence for irritation of your liver require additional evaluation by our gastroenterology colleagues. Please be sure to call tomorrow for the next available appointment.

## 2017-06-30 NOTE — ED Notes (Signed)
Patient transported to CT 

## 2017-06-30 NOTE — ED Triage Notes (Signed)
C/o dull generalized abd pain since Tuesday that radiates to thoracic back.  Thought she had gas so she took Colace and then had diarrhea all day Thursday.  Denies diarrhea or constipation at this time.  Denied nausea prior to arrival but pt became nauseous and vomited x 1 on arrival to ED.

## 2017-07-08 DIAGNOSIS — Z9049 Acquired absence of other specified parts of digestive tract: Secondary | ICD-10-CM | POA: Diagnosis not present

## 2017-07-08 DIAGNOSIS — R1032 Left lower quadrant pain: Secondary | ICD-10-CM | POA: Diagnosis not present

## 2017-07-08 DIAGNOSIS — R945 Abnormal results of liver function studies: Secondary | ICD-10-CM | POA: Diagnosis not present

## 2017-07-09 ENCOUNTER — Encounter (HOSPITAL_COMMUNITY): Payer: Self-pay | Admitting: Emergency Medicine

## 2017-07-09 ENCOUNTER — Inpatient Hospital Stay (HOSPITAL_COMMUNITY)
Admission: EM | Admit: 2017-07-09 | Discharge: 2017-07-11 | DRG: 446 | Disposition: A | Discharge status: Home / Self Care | Payer: BLUE CROSS/BLUE SHIELD | Attending: Internal Medicine | Admitting: Internal Medicine

## 2017-07-09 ENCOUNTER — Inpatient Hospital Stay (HOSPITAL_COMMUNITY): Payer: BLUE CROSS/BLUE SHIELD

## 2017-07-09 ENCOUNTER — Emergency Department (HOSPITAL_COMMUNITY): Payer: BLUE CROSS/BLUE SHIELD

## 2017-07-09 DIAGNOSIS — R7989 Other specified abnormal findings of blood chemistry: Secondary | ICD-10-CM

## 2017-07-09 DIAGNOSIS — Z880 Allergy status to penicillin: Secondary | ICD-10-CM | POA: Diagnosis not present

## 2017-07-09 DIAGNOSIS — Z9049 Acquired absence of other specified parts of digestive tract: Secondary | ICD-10-CM

## 2017-07-09 DIAGNOSIS — Z90710 Acquired absence of both cervix and uterus: Secondary | ICD-10-CM | POA: Diagnosis not present

## 2017-07-09 DIAGNOSIS — K8051 Calculus of bile duct without cholangitis or cholecystitis with obstruction: Principal | ICD-10-CM | POA: Diagnosis present

## 2017-07-09 DIAGNOSIS — R945 Abnormal results of liver function studies: Secondary | ICD-10-CM | POA: Diagnosis not present

## 2017-07-09 DIAGNOSIS — K8020 Calculus of gallbladder without cholecystitis without obstruction: Secondary | ICD-10-CM | POA: Diagnosis not present

## 2017-07-09 DIAGNOSIS — K9186 Retained cholelithiasis following cholecystectomy: Secondary | ICD-10-CM | POA: Diagnosis not present

## 2017-07-09 DIAGNOSIS — K8050 Calculus of bile duct without cholangitis or cholecystitis without obstruction: Secondary | ICD-10-CM | POA: Diagnosis present

## 2017-07-09 DIAGNOSIS — R740 Nonspecific elevation of levels of transaminase and lactic acid dehydrogenase [LDH]: Secondary | ICD-10-CM

## 2017-07-09 DIAGNOSIS — K3189 Other diseases of stomach and duodenum: Secondary | ICD-10-CM | POA: Diagnosis not present

## 2017-07-09 DIAGNOSIS — R7401 Elevation of levels of liver transaminase levels: Secondary | ICD-10-CM | POA: Diagnosis present

## 2017-07-09 DIAGNOSIS — R109 Unspecified abdominal pain: Secondary | ICD-10-CM | POA: Diagnosis present

## 2017-07-09 DIAGNOSIS — Z87891 Personal history of nicotine dependence: Secondary | ICD-10-CM | POA: Diagnosis not present

## 2017-07-09 DIAGNOSIS — R748 Abnormal levels of other serum enzymes: Secondary | ICD-10-CM

## 2017-07-09 DIAGNOSIS — Z8042 Family history of malignant neoplasm of prostate: Secondary | ICD-10-CM | POA: Diagnosis not present

## 2017-07-09 DIAGNOSIS — K5909 Other constipation: Secondary | ICD-10-CM

## 2017-07-09 DIAGNOSIS — Z23 Encounter for immunization: Secondary | ICD-10-CM | POA: Diagnosis not present

## 2017-07-09 DIAGNOSIS — Z800 Family history of malignant neoplasm of digestive organs: Secondary | ICD-10-CM

## 2017-07-09 DIAGNOSIS — Z79890 Hormone replacement therapy (postmenopausal): Secondary | ICD-10-CM

## 2017-07-09 DIAGNOSIS — R790 Abnormal level of blood mineral: Secondary | ICD-10-CM | POA: Diagnosis present

## 2017-07-09 DIAGNOSIS — Z881 Allergy status to other antibiotic agents status: Secondary | ICD-10-CM | POA: Diagnosis not present

## 2017-07-09 DIAGNOSIS — Z8249 Family history of ischemic heart disease and other diseases of the circulatory system: Secondary | ICD-10-CM

## 2017-07-09 DIAGNOSIS — K831 Obstruction of bile duct: Secondary | ICD-10-CM | POA: Diagnosis present

## 2017-07-09 DIAGNOSIS — R933 Abnormal findings on diagnostic imaging of other parts of digestive tract: Secondary | ICD-10-CM | POA: Diagnosis not present

## 2017-07-09 DIAGNOSIS — R112 Nausea with vomiting, unspecified: Secondary | ICD-10-CM | POA: Diagnosis not present

## 2017-07-09 DIAGNOSIS — Z8349 Family history of other endocrine, nutritional and metabolic diseases: Secondary | ICD-10-CM

## 2017-07-09 LAB — COMPREHENSIVE METABOLIC PANEL
ALBUMIN: 3.9 g/dL (ref 3.5–5.0)
ALK PHOS: 224 U/L — AB (ref 38–126)
ALT: 324 U/L — ABNORMAL HIGH (ref 14–54)
AST: 228 U/L — AB (ref 15–41)
Anion gap: 7 (ref 5–15)
BILIRUBIN TOTAL: 3.5 mg/dL — AB (ref 0.3–1.2)
BUN: 5 mg/dL — AB (ref 6–20)
CALCIUM: 9.8 mg/dL (ref 8.9–10.3)
CO2: 27 mmol/L (ref 22–32)
CREATININE: 0.92 mg/dL (ref 0.44–1.00)
Chloride: 104 mmol/L (ref 101–111)
GFR calc Af Amer: >60 mL/min (ref >60)
GLUCOSE: 162 mg/dL — AB (ref 65–99)
Potassium: 4.2 mmol/L (ref 3.5–5.1)
Sodium: 138 mmol/L (ref 135–145)
TOTAL PROTEIN: 7 g/dL (ref 6.5–8.1)

## 2017-07-09 LAB — CBC
HCT: 43.8 % (ref 36.0–46.0)
Hemoglobin: 14.8 g/dL (ref 12.0–15.0)
MCH: 32.2 pg (ref 26.0–34.0)
MCHC: 33.8 g/dL (ref 30.0–36.0)
MCV: 95.4 fL (ref 78.0–100.0)
PLATELETS: 249 10*3/uL (ref 150–400)
RBC: 4.59 MIL/uL (ref 3.87–5.11)
RDW: 12.7 % (ref 11.5–15.5)
WBC: 5.9 10*3/uL (ref 4.0–10.5)

## 2017-07-09 LAB — LIPASE, BLOOD: Lipase: 25 U/L (ref 11–51)

## 2017-07-09 LAB — URINALYSIS, ROUTINE W REFLEX MICROSCOPIC
GLUCOSE, UA: NEGATIVE mg/dL
Hgb urine dipstick: NEGATIVE
Ketones, ur: 20 mg/dL — AB
LEUKOCYTES UA: NEGATIVE
NITRITE: NEGATIVE
PROTEIN: 100 mg/dL — AB
SPECIFIC GRAVITY, URINE: 1.018 (ref 1.005–1.030)
WBC, UA: NONE SEEN WBC/hpf (ref 0–5)
pH: 9 — ABNORMAL HIGH (ref 5.0–8.0)

## 2017-07-09 MED ORDER — KETOROLAC TROMETHAMINE 30 MG/ML IJ SOLN: 30.0000 mg | Freq: Four times a day (QID) | INTRAMUSCULAR | Status: DC | PRN

## 2017-07-09 MED ORDER — SODIUM CHLORIDE 0.9 % IV BOLUS (SEPSIS)
1000.0000 mL | Freq: Once | INTRAVENOUS | Status: AC
  Administered 2017-07-09: 1000 mL via INTRAVENOUS

## 2017-07-09 MED ORDER — ENOXAPARIN SODIUM 40 MG/0.4ML ~~LOC~~ SOLN
40.0000 mg | SUBCUTANEOUS | Status: DC
  Filled 2017-07-09: qty 0.4

## 2017-07-09 MED ORDER — INFLUENZA VAC SPLIT QUAD 0.5 ML IM SUSY
0.5000 mL | PREFILLED_SYRINGE | INTRAMUSCULAR | Status: AC
  Administered 2017-07-10: 0.5 mL via INTRAMUSCULAR
  Filled 2017-07-09: qty 0.5

## 2017-07-09 MED ORDER — BISACODYL 10 MG RE SUPP: 10.0000 mg | Freq: Every day | RECTAL | Status: DC | PRN

## 2017-07-09 MED ORDER — DEXTROSE-NACL 5-0.9 % IV SOLN
INTRAVENOUS | Status: AC
  Administered 2017-07-09 – 2017-07-10 (×3): via INTRAVENOUS

## 2017-07-09 MED ORDER — ONDANSETRON HCL 4 MG/2ML IJ SOLN
4.0000 mg | Freq: Once | INTRAMUSCULAR | Status: AC
  Administered 2017-07-09: 4 mg via INTRAVENOUS
  Filled 2017-07-09: qty 2

## 2017-07-09 MED ORDER — ONDANSETRON HCL 4 MG/2ML IJ SOLN: 4.0000 mg | Freq: Four times a day (QID) | INTRAMUSCULAR | Status: DC | PRN

## 2017-07-09 MED ORDER — GADOBENATE DIMEGLUMINE 529 MG/ML IV SOLN
14.0000 mL | Freq: Once | INTRAVENOUS | Status: AC | PRN
  Administered 2017-07-09: 14 mL via INTRAVENOUS

## 2017-07-09 MED ORDER — FENTANYL CITRATE (PF) 100 MCG/2ML IJ SOLN
25.0000 ug | Freq: Once | INTRAMUSCULAR | Status: AC
  Administered 2017-07-09: 25 ug via INTRAVENOUS
  Filled 2017-07-09: qty 2

## 2017-07-09 NOTE — ED Triage Notes (Signed)
Patient reports generalized abdominal pain with emesis and mid back pain onset last week , diagnosed by her gastroenterologist with gallstones .

## 2017-07-09 NOTE — ED Provider Notes (Signed)
[facility] EMERGENCY DEPARTMENT Provider Note   CSN: [id number] Arrival date & time: 07/09/17  4627     History   Chief Complaint Chief Complaint  Patient presents with  . Abdominal Pain    HPI Sabrina Ho is a 56 y.o. female.  HPI    Patient is a 56 year old female presenting with worsening abdominal pain. Patient was seen on the 14th of this month for abdominal pain. Found to have elevated liver enzymes. CAT scan showed no evidence of obstruction or fibrosis. Patient has had a bladder removal in 2010. Patient saw Dr. Daisey Must today, gastroenterology for follow-up. He thought that she could have a retained stone and wasplanning to do outpatient MRCP. However she reports the pain got much worse overnight she was unable to handle her pains came here to the emergency part. Patient has had 2 episodes of vomiting last 2 weeks. Otherwise no recent travel, changes in dietary.    History reviewed. No pertinent past medical history.  There are no active problems to display for this patient.   Past Surgical History:  Procedure Laterality Date  . ABDOMINAL HYSTERECTOMY    . BREAST LUMPECTOMY WITH RADIOACTIVE SEED LOCALIZATION Left 08/15/2015   Procedure: BREAST LUMPECTOMY WITH RADIOACTIVE SEED LOCALIZATION;  Surgeon: Autumn Messing III, MD;  Location: [facility];  Service: General;  Laterality: Left;  . CESAREAN SECTION    . CHOLECYSTECTOMY    . TUBAL LIGATION      OB History    No data available       Home Medications    Prior to Admission medications   Medication Sig Start Date End Date Taking? Authorizing Provider  Cholecalciferol (VITAMIN D PO) Take 1 tablet by mouth daily.    [provider]  Estradiol (MINIVELLE TD) Place 1 patch onto the skin 2 (two) times a week.    [provider]  ondansetron (ZOFRAN ODT) 4 MG disintegrating tablet Take 1 tablet (4 mg total) by mouth every 8 (eight) hours as needed for nausea or  vomiting. 06/30/17   Carmin Muskrat, MD  traMADol (ULTRAM) 50 MG tablet Take 1 tablet (50 mg total) by mouth every 6 (six) hours as needed. 06/30/17   Carmin Muskrat, MD    Family History Family History  Problem Relation Age of Onset  . Hypertension Mother     Social History Social History  Substance Use Topics  . Smoking status: Former Research scientist (life sciences)  . Smokeless tobacco: Never Used  . Alcohol use No     Allergies   Amoxicillin   Review of Systems Review of Systems  Constitutional: Negative for activity change, fatigue and fever.  Respiratory: Negative for shortness of breath.   Cardiovascular: Negative for chest pain.  Gastrointestinal: Positive for abdominal pain, nausea and vomiting.     Physical Exam Updated Vital Signs BP 115/74   Pulse 64   Temp 98.1 F (36.7 C) (Oral)   Resp 16   Ht 5\' 4"  (1.626 m)   Wt 62.6 kg (138 lb)   SpO2 99%   BMI 23.69 kg/m   Physical Exam  Constitutional: She is oriented to person, place, and time. She appears well-developed and well-nourished.  HENT:  Head: Normocephalic and atraumatic.  Eyes: Right eye exhibits no discharge.  Cardiovascular: Normal rate, regular rhythm and normal heart sounds.   No murmur heard. Pulmonary/Chest: Effort normal and breath sounds normal. She has no wheezes. She has no rales.  Abdominal: Soft. She exhibits no distension.  There is tenderness.  Mild non focal tenderness  Neurological: She is oriented to person, place, and time.  Skin: Skin is warm and dry. She is not diaphoretic.  Psychiatric: She has a normal mood and affect.  Nursing note and vitals reviewed.    ED Treatments / Results  Labs (all labs ordered are listed, but only abnormal results are displayed) Labs Reviewed  COMPREHENSIVE METABOLIC PANEL - Abnormal; Notable for the following:       Result Value   Glucose, Bld 162 (*)    BUN 5 (*)    AST 228 (*)    ALT 324 (*)    Alkaline Phosphatase 224 (*)    Total Bilirubin 3.5 (*)     All other components within normal limits  URINALYSIS, ROUTINE W REFLEX MICROSCOPIC - Abnormal; Notable for the following:    Color, Urine AMBER (*)    APPearance CLOUDY (*)    pH 9.0 (*)    Bilirubin Urine SMALL (*)    Ketones, ur 20 (*)    Protein, ur 100 (*)    Bacteria, UA FEW (*)    Squamous Epithelial / LPF 0-5 (*)    All other components within normal limits  LIPASE, BLOOD  CBC    EKG  EKG Interpretation None       Radiology No results found.  Procedures Procedures (including critical care time)  Medications Ordered in ED Medications - No data to display   Initial Impression / Assessment and Plan / ED Course  I have reviewed the triage vital signs and the nursing notes.  Pertinent labs & imaging results that were available during my care of the patient were reviewed by me and considered in my medical decision making (see chart for details).     Patient is a 56 year old female presenting with worsening abdominal pain. Patient was seen on the 14th of this month for abdominal pain. Found to have elevated liver enzymes. CAT scan showed no evidence of obstruction or fibrosis. Patient has had a bladder removal in 2010. Patient saw Dr. Daisey Must yesterdaty gastroenterology for follow-up. He thought that she could have a retained stone and wasplanning to do outpatient MRCP. However she reports the pain got much worse overnight she was unable to handle her pains came here to the emergency part. Patient has had 2 episodes of vomiting last 2 weeks. Otherwise no recent travel, changes in dietary.   8:36 AM   Discussed with [facility].  Will admit, plans to do MCRP/ECRP as inpatient.   11:08 AM Discussed with [facility] because I thought her MD was [facility]. It turns out physician was Dr. Earlean Shawl. . Had attempted to reach this physician. I have called his office, 6. I talked to several secretaries, his scheduler. I was given a referral phone number for [facility].  I have not received a phone call back from their fellowship service. Even was able to get his personal cell phone and texted him.. I will call [facility] and have them take on the patient givne the need for GI consult and inability to reach primary GI MD.   Final Clinical Impressions(s) / ED Diagnoses   Final diagnoses:  Elevated liver enzymes    New Prescriptions New Prescriptions   No medications on file     Macarthur Critchley, MD 07/09/17 1116

## 2017-07-09 NOTE — Progress Notes (Signed)
Pt admitted to the unit at 1130. Pt mental status is a &o x 4. Pt oriented to room, staff, and call bell. Skin is intact. Full assessment charted in [facility]. Call bell within reach. Visitor guidelines reviewed w/ pt and/or family.

## 2017-07-09 NOTE — ED Notes (Signed)
Patient transported to Ultrasound 

## 2017-07-09 NOTE — Consult Note (Signed)
[facility] Consult: 2:30 PM 07/09/2017  LOS: 0 days    Referring Provider: Dr Heber [place]  Primary Care Physician:  Patient, No Pcp Per goes to an Urgent [facility] as needed.  Gyn visits on annual basis.   Primary Gastroenterologist:  Dr. Earlie Raveling, he does not do ERCP    Reason for Consultation:  Choledocholithiasis.     HPI: Sabrina Ho is a 56 y.o. female.   09/2014 screening colonoscopy: 2 right colon adenomas, rescope due 09/2019.   Seen yesterday by Dr Earlean Shawl for isolated attacks of abdominal radiating to back pain, bilious emesis dating to 02/2017 and elevated LFTs 06/30/17.  Seen 10/14 at ED and LFTs elevated.  T bili 2.6,  Alk phos 183, AST/ALT 336/491, lipase 22.  CT showed normal CBD.  Surgically absent GB.   Dr Earlean Shawl suspected choledocholithiasis vs SOD.  Planned MRCP, EGD in MRCP negative, ERCP if positive.   Pt had another severe pain episode and returned to ED T bili 3.5, alk pho 224, AST/ALT 228/324.  Lipase 25.  No fever, WBCs normal.   Ultrasound: absent GB.  Mild dilation of intra/extrahepatic ducts.   MRCP confirms 56m CBD, distal CBD tapering sugg of stricture, small filling defect c/w CBD stone.  Incidental note of excessive hepatic iron deposition noted.    Patient doesn't consume alcoholic beverages or smoke cigarettes. Fm Hx + for cholecystectomy in her mom as an adult.      History reviewed. No pertinent past medical history.  Past Surgical History:  Procedure Laterality Date  . ABDOMINAL HYSTERECTOMY    . BREAST LUMPECTOMY WITH RADIOACTIVE SEED LOCALIZATION Left 08/15/2015   Procedure: BREAST LUMPECTOMY WITH RADIOACTIVE SEED LOCALIZATION;  Surgeon: PAutumn MessingIII, MD;  [facility]  Service: General;  Laterality: Left;  . CESAREAN SECTION    .  CHOLECYSTECTOMY    . TUBAL LIGATION      Prior to Admission medications   Medication Sig Start Date End Date Taking? Authorizing Provider  estradiol (ESTRACE) 0.1 MG/GM vaginal cream Place 1 Applicatorful vaginally as needed. symptoms   Yes [provider]  estradiol (VIVELLE-DOT) 0.05 MG/24HR patch Place 1 patch onto the skin 2 (two) times a week. Applu Sunday and Wednesday   Yes [provider]  ibuprofen (ADVIL,MOTRIN) 200 MG tablet Take 600-800 mg by mouth every 6 (six) hours as needed for moderate pain.   Yes [provider]  ondansetron (ZOFRAN ODT) 4 MG disintegrating tablet Take 1 tablet (4 mg total) by mouth every 8 (eight) hours as needed for nausea or vomiting. 06/30/17  Yes LCarmin Muskrat MD  oxyCODONE-acetaminophen (PERCOCET/ROXICET) 5-325 MG tablet Take 1 tablet by mouth every 4 (four) hours as needed for pain. 07/08/17  Yes [provider]  traMADol (ULTRAM) 50 MG tablet Take 1 tablet (50 mg total) by mouth every 6 (six) hours as needed. 06/30/17  Yes LCarmin Muskrat MD    Scheduled Meds:  Infusions: . dextrose 5 % and 0.9% NaCl 100 mL/hr at 07/09/17 1351   PRN Meds: bisacodyl,  ketorolac, ondansetron (ZOFRAN) IV   Allergies as of 07/09/2017 - Review Complete 07/09/2017  Allergen Reaction Noted  . Vancomycin Itching 07/09/2017  . Amoxicillin Rash 09/07/2012    Family History  Problem Relation Age of Onset  . Hypertension Mother   . Gallbladder disease Mother        open cholecystectomy in mid-life.      Social History   Social History  . Marital status: Married   Social History Main Topics  . Smoking status: Former Research scientist (life sciences)  . Smokeless tbacco: Never Used  . Alcohol use No  . Drug use: No  . Sexual activity: Yes    REVIEW OF SYSTEMS: Constitutional:  No weakness or fatigue. ENT:  No nose bleeds Pulm:  No trouble breathing or cough. CV:  No palpitations, no LE edema.  No chest pain GU:  No hematuria, no  frequency GI:  No dysphagia. No irregular bowel habits. No bleeding per rectum Heme:  No unusual or excessive bleeding/bruising   Transfusions:  none Neuro:  No headaches, no peripheral tingling or numbness Derm:  No itching, no rash or sores.  Endocrine:  No sweats or chills.  No polyuria or dysuria Immunization:  I did not inquire as to recent immunizations.    PHYSICAL EXAM: Vital signs in last 24 hours: Vitals:   07/09/17 1136 07/09/17 1347  BP: (!) 114/49 (!) 101/50  Pulse: (!) 57 (!) 56  Resp: 18 18  Temp: 99 F (37.2 C) 98.6 F (37 C)  SpO2: 99% 98%   Wt Readings from Last 3 Encounters:  07/09/17 62.9 kg (138 lb 11.2 oz)  08/15/15 63 kg (139 lb)  09/07/12 59.2 kg (130 lb 9.6 oz)    General: pleasant, comfortable, well appearing WF, appears her stated age. Head:  No facial asymmetry or swelling. No signs of head trauma.  Eyes:  No scleral icterus, no conjunctival pallor. EOMI. Ears:  Not hard of hearing  Nose:  No congestion or discharge Mouth:  Tongue midline. Oral mucosa pink, moist, clear. Good dentition. Neck:  No JVD, no masses, no thyromegaly Lungs:  Clear bilaterally. No cough or labored breathing. Heart: RRR. No MRG. S1, S2 present. Abdomen:  Oft. Not tender or distended. Active bowel sounds. No masses. No HSM. No bruits or hernias..   Rectal: deferred   Musc/Skeltl: no joint redness, swelling or obvious deformity. Extremities:  No CCE.  Neurologic:  Fully alert. Appropriate. Oriented times 3. Good historian. Moves all 4 limbs with full limb strength. No tremors. No gross deficits. Skin:  No rashes, sores, telangiectasia or significant bruising. Tattoos:  none Nodes:  No cervical or inguinal adenopathy.   Psych:  Pleasant, cooperative. Not depressed or anxious.  Intake/Output from previous day: No intake/output data recorded. Intake/Output this shift: No intake/output data recorded.  LAB RESULTS:  Recent Labs  07/09/17 0350  WBC 5.9  HGB 14.8    HCT 43.8  PLT 249   BMET Lab Results  Component Value Date   NA 138 07/09/2017   NA 137 06/30/2017   K 4.2 07/09/2017   K 3.4 (L) 06/30/2017   CL 104 07/09/2017   CL 104 06/30/2017   CO2 27 07/09/2017   CO2 24 06/30/2017   GLUCOSE 162 (H) 07/09/2017   GLUCOSE 158 (H) 06/30/2017   BUN 5 (L) 07/09/2017   BUN 7 06/30/2017   CREATININE 0.92 07/09/2017   CREATININE 1.00 06/30/2017   CALCIUM 9.8 07/09/2017   CALCIUM 9.1 06/30/2017   LFT  Recent Labs  07/09/17 0350  PROT 7.0  ALBUMIN 3.9  AST 228*  ALT 324*  ALKPHOS 224*  BILITOT 3.5*   Lipase     Component Value Date/Time   LIPASE 25 07/09/2017 0350     RADIOLOGY STUDIES: Mr Abdomen Mrcp Moise Boring Contast  Result Date: 07/09/2017 CLINICAL DATA:  56 year old female with history of abdominal pain nausea and vomiting past couple weeks. Elevated liver function tests. Dilatation of the bile ducts. EXAM: MRI ABDOMEN WITHOUT AND WITH CONTRAST (INCLUDING MRCP) TECHNIQUE: Multiplanar multisequence MR imaging of the abdomen was performed both before and after the administration of intravenous contrast. Heavily T2-weighted images of the biliary and pancreatic ducts were obtained, and three-dimensional MRCP images were rendered by post processing. CONTRAST:  32m MULTIHANCE GADOBENATE DIMEGLUMINE 529 MG/ML IV SOLN COMPARISON:  No prior abdominal MRI. CT the abdomen and pelvis 06/30/2017. Abdominal ultrasound 07/09/2017. FINDINGS: Lower chest: Unremarkable. Hepatobiliary: Diffuse loss of signal intensity on in-phase dual echo images, and diffuse low signal intensity on T2 weighted sequences, indicative of significant iron deposition in the hepatic parenchyma. No suspicious cystic or solid hepatic lesions. MRCP images demonstrate some very mild intrahepatic biliary ductal dilatation. Common bile duct is dilated up to 13 mm in the porta hepatis. 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 In the mid to distal common bile duct there is a small filling defect measuring 5 mm (image 26 of series 6), compatible with retained choledocholithiasis. Status post cholecystectomy. Pancreas: No pancreatic mass. No pancreatic ductal dilatation. No pancreatic or peripancreatic fluid or inflammatory changes. Spleen:  Unremarkable. Adrenals/Urinary Tract: Bilateral kidneys and bilateral adrenal glands are normal in appearance. No hydroureteronephrosis in the visualized portions of the abdomen. Stomach/Bowel: Visualized portions are unremarkable. Vascular/Lymphatic: No aneurysm identified in the visualized abdominal vasculature. No lymphadenopathy noted visualized portions of the abdomen. Other: No significant volume of ascites noted in the visualized portions of the peritoneal cavity. Musculoskeletal: No aggressive appearing osseous lesions are noted in the visualized portions of the skeleton. IMPRESSION: 1. Study is positive for choledocholithiasis. 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 Common bile duct is mildly dilated measuring 13 mm in the porta hepatis, with mild intrahepatic biliary ductal dilatation. 2. Excessive hepatic iron deposition noted. Electronically Signed   By: DVinnie LangtonM.D.   On: 07/09/2017 13:36   UKoreaAbdomen Limited Ruq  Result Date: 07/09/2017 CLINICAL DATA:  Elevated liver enzymes EXAM: ULTRASOUND ABDOMEN LIMITED RIGHT UPPER QUADRANT COMPARISON:  CT abdomen and pelvis June 30, 2017 FINDINGS: Gallbladder: Surgically absent. Common bile duct: Diameter: 9 mm, within normal limits for post cholecystectomy state. No biliary duct mass or calculus evident. There is slight intrahepatic biliary duct dilatation. Liver: No focal lesion identified. Within normal limits in parenchymal echogenicity. Portal vein is patent on color Doppler imaging with normal direction of blood  flow towards the liver. IMPRESSION: Gallbladder absent. Mild intrahepatic and extrahepatic biliary duct prominence which may be within normal limits for post cholecystectomy state. Study otherwise unremarkable. Electronically Signed   By: WLowella GripIII M.D.   On: 07/09/2017 09:14    IMPRESSION:   *  Symptomatic choledocholithiasis with bouts of abdominal pain and rising, elevated LFTs.  *  Incidental finding of excessive hepatic iron deposition on CT scan.  No fm or personal hx hemachromatosis. no personal hx of blood  transfusions or po iron supplements.      PLAN:     *  ERCP is set up for 1 PM tomorrow. She can have clear liquids for the rest of today and will be NPO after midnight.  *  Discontinued Lovenox and will order PAS hose.  Encouraged patient to walk in order to prevent DVTs.   Azucena Freed  07/09/2017, 2:30 PM Pager: [phone]    [facility] Attending   I have taken an interval history, reviewed the chart and examined the patient. I agree with the Advanced Practitioner's note, impression and recommendations.   Check fasting ferritin tomorrow.  The risks and benefits as well as alternatives of endoscopic procedure(s) have been discussed and reviewed. All questions answered. The patient agrees to proceed.  Gatha Mayer, MD, [facility] [phone] (pager) 07/09/2017 6:00 PM

## 2017-07-09 NOTE — H&P (Signed)
Date: 07/09/2017               Patient Name:  Sabrina Ho MRN: [id number]  DOB: 07-Feb-1961 Age / Sex: 56 y.o., female   PCP: Patient, No Pcp Per         Medical Service: Internal Medicine Teaching Service         Attending Physician: Dr. Lucious Groves, DO    First Contact: Dr. Aggie Hacker Pager: [phone]  Second Contact: Dr. Jari Favre Pager: [phone]       After Hours (After 5p/  First Contact Pager: [phone]  weekends / holidays): Second Contact Pager: [phone]   Chief Complaint: abdominal pain, N/V  History of Present Illness: Patient is 56 year old female with no significant past medical history presenting to the hospital with a chief complaint of abdominal pain, nausea, and vomiting. Reports having a cholecystectomy 18 years ago. States she started experiencing diffuse intermittent abdominal pain radiating to her back about 2 weeks ago. The pain is not associated with eating. She was seen in the ED on 06/30/2017 and labs revealed elevated LFTs. CT of abdomen/pelvis done at that time did not reveal an acute hepatobiliary abnormality. Patient states she visited GI (Dr. Earlean Shawl) yesterday and he thought she might have a retained gallstone. He scheduled her for an outpatient MRCP, however, her pain became acutely worse overnight at around 10:30 PM. She had an egg sandwich several hours prior to the onset of pain. The pain was 10 out of 10 in intensity and constant for the next 8 hours. Per patient, the pain finally subsided on its own even before she received a dose of fentanyl in the ED. Also reports having one episode of emesis last week and 3 episodes of non bloody, non bilious emesis overnight. Denies having any diarrhea. Reports having chronic constipation - has a bowel movement every 2-3 days on average; last bowel movement 2 days ago, continues to pass flatus. Denies having any hematochezia or melena. Denies having any fevers or chills. No other complaints. Her vitals were stable on  arrival. AST 228, ALT 324, alkaline phosphatase 224, and T bili 3.5. Lipase normal. No leukocytosis. UA not suggestive of infection. Right upper quadrant abdominal ultrasound showing mild intrahepatic and extrahepatic biliary duct prominence.  Meds:  No outpatient prescriptions have been marked as taking for the 07/09/17 encounter [facility] Encounter).     Allergies: Allergies as of 07/09/2017 - Review Complete 07/09/2017  Allergen Reaction Noted  . Amoxicillin Rash 09/07/2012   History reviewed. No pertinent past medical history.  Family History: Father has hypertension and prostate cancer. Mother has hypertension and a thyroid problem. Sister had a heart valve replaced and has rectal cancer.  Social History: Denies any tobacco, ethanol, or illicit drug use.  Review of Systems: A complete ROS was negative except as per HPI.  Physical Exam: Blood pressure (!) 124/53, pulse 61, temperature 98.1 F (36.7 C), temperature source Oral, resp. rate 16, height 5\' 4"  (1.626 m), weight 138 lb (62.6 kg), SpO2 100 %. Physical Exam  Constitutional: She is oriented to person, place, and time. She appears well-developed and well-nourished. No distress.  Resting comfortably in a hospital stretcher watching the television.  HENT:  Head: Normocephalic and atraumatic.  Mouth/Throat: Oropharynx is clear and moist.  Eyes: Pupils are equal, round, and reactive to light. Scleral icterus is present.  Neck: Neck supple. No tracheal deviation present.  Cardiovascular: Normal rate, regular rhythm and intact distal pulses.  Exam reveals no  gallop and no friction rub.   No murmur heard. Pulmonary/Chest: Effort normal and breath sounds normal. No respiratory distress. She has no wheezes. She has no rales.  Abdominal: Soft. Bowel sounds are normal. She exhibits no distension. There is no tenderness. There is no guarding.  Musculoskeletal: She exhibits no edema.  Neurological: She is alert and oriented to  person, place, and time.  Skin: Skin is warm and dry.  Psychiatric: She has a normal mood and affect. Her behavior is normal.   Assessment & Plan by Problem: Active Problems:   Transaminitis  56 year old female with no significant past medical history presenting to the hospital with a chief complaint of abdominal pain, nausea, and vomiting.  Transaminitis: Likely due to choledocholithiasis. History of prior cholecystectomy. AST 228, ALT 324, alkaline phosphatase 224, and T bili 3.5. Lipase normal. No leukocytosis. UA not suggestive of infection. Right upper quadrant abdominal ultrasound showing mild intrahepatic and extrahepatic biliary duct prominence. Patient is comfortable on exam at present and her abdominal pain has resolved. -Admit to [facility] -ED physician has contacted Dr. Liliane Channel office. She will likely need an ERCP as an inpatient.  -Toradol 30 mg every 6 hours as needed for pain -Zofran 4 mg every 4 hours as needed for nausea -Patient has received 1 L normal saline in the ED, continueD5-NS@ 100 cc/hr as she is NPO -Repeat CMP in a.m.  Chronic constipation: Last BM 2 days ago, passing flatus. +BS on exam. No abdominal pain or distention.  -Currently NPO; Dulcolax suppository daily when necessary  Hormone replacement therapy: On twice weekly (Sun and Wed) estradiol patch.  -Resume home estradiol patch tomorrow   Diet: NPO DVT ppx: Lovenox Code: Full (confirmed with patient)   Dispo: Admit patient to Inpatient with expected length of stay greater than 2 midnights.  Signed: Shela Leff, MD 07/09/2017, 10:23 AM  Pager: [phone]

## 2017-07-10 ENCOUNTER — Encounter (HOSPITAL_COMMUNITY): Payer: Self-pay | Admitting: *Deleted

## 2017-07-10 ENCOUNTER — Inpatient Hospital Stay (HOSPITAL_COMMUNITY): Payer: BLUE CROSS/BLUE SHIELD | Admitting: Certified Registered Nurse Anesthetist

## 2017-07-10 ENCOUNTER — Inpatient Hospital Stay (HOSPITAL_COMMUNITY): Payer: BLUE CROSS/BLUE SHIELD

## 2017-07-10 ENCOUNTER — Encounter (HOSPITAL_COMMUNITY)
Admission: EM | Disposition: A | Discharge status: Home / Self Care | Payer: Self-pay | Source: Home / Self Care | Attending: Internal Medicine

## 2017-07-10 DIAGNOSIS — K831 Obstruction of bile duct: Secondary | ICD-10-CM

## 2017-07-10 DIAGNOSIS — R790 Abnormal level of blood mineral: Secondary | ICD-10-CM | POA: Diagnosis present

## 2017-07-10 HISTORY — PX: ERCP: SHX5425

## 2017-07-10 LAB — IRON AND TIBC
Iron: 266 ug/dL — ABNORMAL HIGH (ref 28–170)
Saturation Ratios: 86 % — ABNORMAL HIGH (ref 10.4–31.8)
TIBC: 309 ug/dL (ref 250–450)
UIBC: 43 ug/dL

## 2017-07-10 LAB — COMPREHENSIVE METABOLIC PANEL
ALT: 243 U/L — ABNORMAL HIGH (ref 14–54)
ANION GAP: 5 (ref 5–15)
AST: 110 U/L — AB (ref 15–41)
Albumin: 3 g/dL — ABNORMAL LOW (ref 3.5–5.0)
Alkaline Phosphatase: 175 U/L — ABNORMAL HIGH (ref 38–126)
BILIRUBIN TOTAL: 1.6 mg/dL — AB (ref 0.3–1.2)
CHLORIDE: 109 mmol/L (ref 101–111)
CO2: 27 mmol/L (ref 22–32)
Calcium: 8.8 mg/dL — ABNORMAL LOW (ref 8.9–10.3)
Creatinine, Ser: 0.76 mg/dL (ref 0.44–1.00)
GFR calc Af Amer: >60 mL/min (ref >60)
Glucose, Bld: 119 mg/dL — ABNORMAL HIGH (ref 65–99)
POTASSIUM: 3.8 mmol/L (ref 3.5–5.1)
Sodium: 141 mmol/L (ref 135–145)
TOTAL PROTEIN: 5.5 g/dL — AB (ref 6.5–8.1)

## 2017-07-10 LAB — FERRITIN: FERRITIN: 362 ng/mL — AB (ref 11–307)

## 2017-07-10 LAB — HIV ANTIBODY (ROUTINE TESTING W REFLEX): HIV Screen 4th Generation wRfx: NONREACTIVE

## 2017-07-10 SURGERY — ERCP, WITH INTERVENTION IF INDICATED
Anesthesia: General

## 2017-07-10 MED ORDER — CIPROFLOXACIN IN D5W 400 MG/200ML IV SOLN
400.0000 mg | Freq: Once | INTRAVENOUS | Status: AC
  Administered 2017-07-10: 400 mg via INTRAVENOUS

## 2017-07-10 MED ORDER — FENTANYL CITRATE (PF) 100 MCG/2ML IJ SOLN
INTRAMUSCULAR | Status: DC | PRN
  Administered 2017-07-10: 100 ug via INTRAVENOUS
  Administered 2017-07-10: 50 ug via INTRAVENOUS

## 2017-07-10 MED ORDER — INDOMETHACIN 50 MG RE SUPP
RECTAL | Status: DC | PRN
  Administered 2017-07-10 (×2): 50 mg via RECTAL

## 2017-07-10 MED ORDER — LACTATED RINGERS IV SOLN
INTRAVENOUS | Status: DC | PRN
  Administered 2017-07-10 (×2): via INTRAVENOUS

## 2017-07-10 MED ORDER — CIPROFLOXACIN IN D5W 400 MG/200ML IV SOLN
INTRAVENOUS | Status: AC
  Filled 2017-07-10: qty 200

## 2017-07-10 MED ORDER — LACTATED RINGERS IV BOLUS (SEPSIS)
1000.0000 mL | Freq: Once | INTRAVENOUS | Status: AC
  Administered 2017-07-10: 1000 mL via INTRAVENOUS

## 2017-07-10 MED ORDER — INDOMETHACIN 50 MG RE SUPP: 100.0000 mg | Freq: Once | RECTAL | Status: DC

## 2017-07-10 MED ORDER — IOPAMIDOL (ISOVUE-300) INJECTION 61%
INTRAVENOUS | Status: AC
  Filled 2017-07-10: qty 50

## 2017-07-10 MED ORDER — ROCURONIUM BROMIDE 100 MG/10ML IV SOLN
INTRAVENOUS | Status: DC | PRN
  Administered 2017-07-10: 40 mg via INTRAVENOUS

## 2017-07-10 MED ORDER — SODIUM CHLORIDE 0.9 % IV SOLN
INTRAVENOUS | Status: DC | PRN
  Administered 2017-07-10: 35 mL

## 2017-07-10 MED ORDER — LACTATED RINGERS IV SOLN
INTRAVENOUS | Status: DC
  Administered 2017-07-10 – 2017-07-11 (×2): via INTRAVENOUS

## 2017-07-10 MED ORDER — GLUCAGON HCL RDNA (DIAGNOSTIC) 1 MG IJ SOLR
INTRAMUSCULAR | Status: AC
  Filled 2017-07-10: qty 1

## 2017-07-10 MED ORDER — MIDAZOLAM HCL 5 MG/5ML IJ SOLN
INTRAMUSCULAR | Status: DC | PRN
  Administered 2017-07-10: 2 mg via INTRAVENOUS

## 2017-07-10 MED ORDER — SUGAMMADEX SODIUM 200 MG/2ML IV SOLN
INTRAVENOUS | Status: DC | PRN
  Administered 2017-07-10: 150 mg via INTRAVENOUS

## 2017-07-10 MED ORDER — MIDAZOLAM HCL 2 MG/2ML IJ SOLN
INTRAMUSCULAR | Status: AC
  Filled 2017-07-10: qty 2

## 2017-07-10 MED ORDER — LIDOCAINE HCL (CARDIAC) 20 MG/ML IV SOLN
INTRAVENOUS | Status: DC | PRN
  Administered 2017-07-10: 60 mg via INTRAVENOUS

## 2017-07-10 MED ORDER — GLUCAGON HCL RDNA (DIAGNOSTIC) 1 MG IJ SOLR
INTRAMUSCULAR | Status: DC | PRN
  Administered 2017-07-10: .5 mg via INTRAVENOUS

## 2017-07-10 MED ORDER — ONDANSETRON HCL 4 MG/2ML IJ SOLN
INTRAMUSCULAR | Status: DC | PRN
  Administered 2017-07-10: 4 mg via INTRAVENOUS

## 2017-07-10 MED ORDER — LACTATED RINGERS IV SOLN: INTRAVENOUS | Status: DC

## 2017-07-10 MED ORDER — INDOMETHACIN 50 MG RE SUPP
RECTAL | Status: AC
  Filled 2017-07-10: qty 2

## 2017-07-10 MED ORDER — FENTANYL CITRATE (PF) 250 MCG/5ML IJ SOLN
INTRAMUSCULAR | Status: AC
  Filled 2017-07-10: qty 5

## 2017-07-10 MED ORDER — PROPOFOL 10 MG/ML IV BOLUS
INTRAVENOUS | Status: DC | PRN
  Administered 2017-07-10: 130 mg via INTRAVENOUS

## 2017-07-10 NOTE — Anesthesia Preprocedure Evaluation (Signed)
Anesthesia Evaluation  Patient identified by MRN, date of birth, ID band Patient awake    Reviewed: Allergy & Precautions, NPO status , Patient's Chart, lab work & pertinent test results  History of Anesthesia Complications Negative for: history of anesthetic complications  Airway Mallampati: II  TM Distance: <3 FB Neck ROM: Full    Dental  (+) Teeth Intact, Dental Advisory Given   Pulmonary former smoker,    Pulmonary exam normal breath sounds clear to auscultation       Cardiovascular Exercise Tolerance: Good (-) hypertension(-) angina(-) CAD, (-) Past MI and (-) CHF negative cardio ROS Normal cardiovascular exam Rhythm:Regular Rate:Normal     Neuro/Psych negative neurological ROS  negative psych ROS   GI/Hepatic negative GI ROS, Neg liver ROS,   Endo/Other  negative endocrine ROS  Renal/GU negative Renal ROS     Musculoskeletal negative musculoskeletal ROS (+)   Abdominal   Peds  Hematology negative hematology ROS (+)   Anesthesia Other Findings Day of surgery medications reviewed with the patient.  Breast Cancer  Reproductive/Obstetrics negative OB ROS                             Anesthesia Physical  Anesthesia Plan  ASA: II  Anesthesia Plan: General   Post-op Pain Management:    Induction: Intravenous  PONV Risk Score and Plan: 2 and Ondansetron and Midazolam  Airway Management Planned: Oral ETT  Additional Equipment:   Intra-op Plan:   Post-operative Plan: Extubation in OR  Informed Consent: I have reviewed the patients History and Physical, chart, labs and discussed the procedure including the risks, benefits and alternatives for the proposed anesthesia with the patient or authorized representative who has indicated his/her understanding and acceptance.   Dental advisory given  Plan Discussed with: CRNA  Anesthesia Plan Comments: (Risks/benefits of general  anesthesia discussed with patient including risk of damage to teeth, lips, gum, and tongue, nausea/vomiting, allergic reactions to medications, and the possibility of heart attack, stroke and death.  All patient questions answered.  Patient wishes to proceed.)        Anesthesia Quick Evaluation

## 2017-07-10 NOTE — Progress Notes (Signed)
   Subjective:  Patient seen and examined, is accompanied by her husband. She is feeling well this morning. She had no abdominal pain overnight. She has no abdominal pain this AM.   Objective:  Vital signs in last 24 hours: Vitals:   07/09/17 1347 07/09/17 2043 07/10/17 0509 07/10/17 1228  BP: (!) 101/50 (!) 115/54 (!) 102/45 (!) 132/37  Pulse: (!) 56 (!) 53 60   Resp: 18 16 18 20   Temp: 98.6 F (37 C) 98.3 F (36.8 C) 98.2 F (36.8 C) 97.7 F (36.5 C)  TempSrc: Oral Oral Oral Oral  SpO2: 98% 97% 96% 100%  Weight:      Height:       General: Laying in bed comfortably, NAD HEENT: [place]/AT, EOMI, no scleral icterus, PERRL Cardiac: RRR, No R/M/G appreciated Pulm: normal effort, CTAB Abd: soft, non tender, non distended, BS normal Ext: extremities well perfused, no peripheral edema Neuro: alert and oriented X3, cranial nerves II-XII grossly intact   Assessment/Plan:  Active Problems:   Transaminitis   Choledocholithiasis  Choledocholithiasis Patient afebrile, normotensive, and currently asymptomatic. MRCP positive for choledocholithiasis with CBD stricture and mild dilation and mild intrahepatic biliary ductal dilatation, also with excessive hepatic iron deposition noted. LFTs trending down; AST 110, ALT 243, T bili 1.6.  Ferritin 362, Sat ratios 86%, iron 266. ERCP today.  -GI consulted -- appreciate recs -Toradol PRN for pain    Dispo: Anticipated discharge in approximately 1-2  day(s).   Melanee Spry, MD 07/10/2017, 1:00 PM Pager: [phone]

## 2017-07-10 NOTE — Transfer of Care (Signed)
Immediate Anesthesia Transfer of Care Note  Patient: NAQUITA NAPPIER  Procedure(s) Performed: ENDOSCOPIC RETROGRADE CHOLANGIOPANCREATOGRAPHY (ERCP) (N/A )  Patient Location: Endoscopy Unit  Anesthesia Type:General  Level of Consciousness: awake, alert  and oriented  Airway & Oxygen Therapy: Patient Spontanous Breathing and Patient connected to nasal cannula oxygen  Post-op Assessment: Report given to RN and Post -op Vital signs reviewed and stable  Post vital signs: Reviewed and stable  Last Vitals:  Vitals:   07/10/17 1436 07/10/17 1438  BP: (!) 141/61   Pulse:    Resp: 18   Temp:  36.7 C  SpO2: 100%     Last Pain:  Vitals:   07/10/17 1438  TempSrc: Oral  PainSc:          Complications: No apparent anesthesia complications

## 2017-07-10 NOTE — Interval H&P Note (Signed)
History and Physical Interval Note:  07/10/2017 1:03 PM  Sabrina Ho  has presented today for surgery, with the diagnosis of choledocholithiasis  The various methods of treatment have been discussed with the patient and family. After consideration of risks, benefits and other options for treatment, the patient has consented to  Procedure(s): ENDOSCOPIC RETROGRADE CHOLANGIOPANCREATOGRAPHY (ERCP) (N/A) as a surgical intervention .  The patient's history has been reviewed, patient examined, no change in status, stable for surgery.  I have reviewed the patient's chart and labs.  Questions were answered to the patient's satisfaction.     Silvano Rusk

## 2017-07-10 NOTE — H&P (View-Only) (Signed)
[facility] Consult: 2:30 PM 07/09/2017  LOS: 0 days    Referring Provider: Dr Heber [place]  Primary Care Physician:  Patient, No Pcp Per goes to an [facility] as needed.  Gyn visits on annual basis.   Primary Gastroenterologist:  Dr. Earlie Raveling, he does not do ERCP    Reason for Consultation:  Choledocholithiasis.     HPI: Sabrina Ho is a 56 y.o. female.   09/2014 screening colonoscopy: 2 right colon adenomas, rescope due 09/2019.   Seen yesterday by Dr Earlean Shawl for isolated attacks of abdominal radiating to back pain, bilious emesis dating to 02/2017 and elevated LFTs 06/30/17.  Seen 10/14 at ED and LFTs elevated.  T bili 2.6,  Alk phos 183, AST/ALT 336/491, lipase 22.  CT showed normal CBD.  Surgically absent GB.   Dr Earlean Shawl suspected choledocholithiasis vs SOD.  Planned MRCP, EGD in MRCP negative, ERCP if positive.   Pt had another severe pain episode and returned to ED T bili 3.5, alk pho 224, AST/ALT 228/324.  Lipase 25.  No fever, WBCs normal.   Ultrasound: absent GB.  Mild dilation of intra/extrahepatic ducts.   MRCP confirms 66m CBD, distal CBD tapering sugg of stricture, small filling defect c/w CBD stone.  Incidental note of excessive hepatic iron deposition noted.    Patient doesn't consume alcoholic beverages or smoke cigarettes. Fm Hx + for cholecystectomy in her mom as an adult.      History reviewed. No pertinent past medical history.  Past Surgical History:  Procedure Laterality Date  . ABDOMINAL HYSTERECTOMY    . BREAST LUMPECTOMY WITH RADIOACTIVE SEED LOCALIZATION Left 08/15/2015   Procedure: BREAST LUMPECTOMY WITH RADIOACTIVE SEED LOCALIZATION;  Surgeon: PAutumn MessingIII, MD;  [place] [facility]  Service: General;  Laterality: Left;  . CESAREAN SECTION    .  CHOLECYSTECTOMY    . TUBAL LIGATION      Prior to Admission medications   Medication Sig Start Date End Date Taking? Authorizing Provider  estradiol (ESTRACE) 0.1 MG/GM vaginal cream Place 1 Applicatorful vaginally as needed. symptoms   Yes [provider]  estradiol (VIVELLE-DOT) 0.05 MG/24HR patch Place 1 patch onto the skin 2 (two) times a week. Applu Sunday and Wednesday   Yes [provider]  ibuprofen (ADVIL,MOTRIN) 200 MG tablet Take 600-800 mg by mouth every 6 (six) hours as needed for moderate pain.   Yes [provider]  ondansetron (ZOFRAN ODT) 4 MG disintegrating tablet Take 1 tablet (4 mg total) by mouth every 8 (eight) hours as needed for nausea or vomiting. 06/30/17  Yes LCarmin Muskrat MD  oxyCODONE-acetaminophen (PERCOCET/ROXICET) 5-325 MG tablet Take 1 tablet by mouth every 4 (four) hours as needed for pain. 07/08/17  Yes [provider]  traMADol (ULTRAM) 50 MG tablet Take 1 tablet (50 mg total) by mouth every 6 (six) hours as needed. 06/30/17  Yes LCarmin Muskrat MD    Scheduled Meds:  Infusions: . dextrose 5 % and 0.9% NaCl 100 mL/hr at 07/09/17 1351   PRN Meds: bisacodyl,  ketorolac, ondansetron (ZOFRAN) IV   Allergies as of 07/09/2017 - Review Complete 07/09/2017  Allergen Reaction Noted  . Vancomycin Itching 07/09/2017  . Amoxicillin Rash 09/07/2012    Family History  Problem Relation Age of Onset  . Hypertension Mother   . Gallbladder disease Mother        open cholecystectomy in mid-life.      Social History   Social History  . Marital status: Married   Social History Main Topics  . Smoking status: Former Smoker  . Smokeless tbacco: Never Used  . Alcohol use No  . Drug use: No  . Sexual activity: Yes    REVIEW OF SYSTEMS: Constitutional:  No weakness or fatigue. ENT:  No nose bleeds Pulm:  No trouble breathing or cough. CV:  No palpitations, no LE edema.  No chest pain GU:  No hematuria, no  frequency GI:  No dysphagia. No irregular bowel habits. No bleeding per rectum Heme:  No unusual or excessive bleeding/bruising   Transfusions:  none Neuro:  No headaches, no peripheral tingling or numbness Derm:  No itching, no rash or sores.  Endocrine:  No sweats or chills.  No polyuria or dysuria Immunization:  I did not inquire as to recent immunizations.    PHYSICAL EXAM: Vital signs in last 24 hours: Vitals:   07/09/17 1136 07/09/17 1347  BP: (!) 114/49 (!) 101/50  Pulse: (!) 57 (!) 56  Resp: 18 18  Temp: 99 F (37.2 C) 98.6 F (37 C)  SpO2: 99% 98%   Wt Readings from Last 3 Encounters:  07/09/17 62.9 kg (138 lb 11.2 oz)  08/15/15 63 kg (139 lb)  09/07/12 59.2 kg (130 lb 9.6 oz)    General: pleasant, comfortable, well appearing WF, appears her stated age. Head:  No facial asymmetry or swelling. No signs of head trauma.  Eyes:  No scleral icterus, no conjunctival pallor. EOMI. Ears:  Not hard of hearing  Nose:  No congestion or discharge Mouth:  Tongue midline. Oral mucosa pink, moist, clear. Good dentition. Neck:  No JVD, no masses, no thyromegaly Lungs:  Clear bilaterally. No cough or labored breathing. Heart: RRR. No MRG. S1, S2 present. Abdomen:  Oft. Not tender or distended. Active bowel sounds. No masses. No HSM. No bruits or hernias..   Rectal: deferred   Musc/Skeltl: no joint redness, swelling or obvious deformity. Extremities:  No CCE.  Neurologic:  Fully alert. Appropriate. Oriented times 3. Good historian. Moves all 4 limbs with full limb strength. No tremors. No gross deficits. Skin:  No rashes, sores, telangiectasia or significant bruising. Tattoos:  none Nodes:  No cervical or inguinal adenopathy.   Psych:  Pleasant, cooperative. Not depressed or anxious.  Intake/Output from previous day: No intake/output data recorded. Intake/Output this shift: No intake/output data recorded.  LAB RESULTS:  Recent Labs  07/09/17 0350  WBC 5.9  HGB 14.8    HCT 43.8  PLT 249   BMET Lab Results  Component Value Date   NA 138 07/09/2017   NA 137 06/30/2017   K 4.2 07/09/2017   K 3.4 (L) 06/30/2017   CL 104 07/09/2017   CL 104 06/30/2017   CO2 27 07/09/2017   CO2 24 06/30/2017   GLUCOSE 162 (H) 07/09/2017   GLUCOSE 158 (H) 06/30/2017   BUN 5 (L) 07/09/2017   BUN 7 06/30/2017   CREATININE 0.92 07/09/2017   CREATININE 1.00 06/30/2017   CALCIUM 9.8 07/09/2017   CALCIUM 9.1 06/30/2017   LFT    Recent Labs  07/09/17 0350  PROT 7.0  ALBUMIN 3.9  AST 228*  ALT 324*  ALKPHOS 224*  BILITOT 3.5*   Lipase     Component Value Date/Time   LIPASE 25 07/09/2017 0350     RADIOLOGY STUDIES: Mr Abdomen Mrcp Moise Boring Contast  Result Date: 07/09/2017 CLINICAL DATA:  56 year old female with history of abdominal pain nausea and vomiting past couple weeks. Elevated liver function tests. Dilatation of the bile ducts. EXAM: MRI ABDOMEN WITHOUT AND WITH CONTRAST (INCLUDING MRCP) TECHNIQUE: Multiplanar multisequence MR imaging of the abdomen was performed both before and after the administration of intravenous contrast. Heavily T2-weighted images of the biliary and pancreatic ducts were obtained, and three-dimensional MRCP images were rendered by post processing. CONTRAST:  42m MULTIHANCE GADOBENATE DIMEGLUMINE 529 MG/ML IV SOLN COMPARISON:  No prior abdominal MRI. CT the abdomen and pelvis 06/30/2017. Abdominal ultrasound 07/09/2017. FINDINGS: Lower chest: Unremarkable. Hepatobiliary: Diffuse loss of signal intensity on in-phase dual echo images, and diffuse low signal intensity on T2 weighted sequences, indicative of significant iron deposition in the hepatic parenchyma. No suspicious cystic or solid hepatic lesions. MRCP images demonstrate some very mild intrahepatic biliary ductal dilatation. Common bile duct is dilated up to 13 mm in the porta hepatis. 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 In the mid to distal common bile duct there is a small filling defect measuring 5 mm (image 26 of series 6), compatible with retained choledocholithiasis. Status post cholecystectomy. Pancreas: No pancreatic mass. No pancreatic ductal dilatation. No pancreatic or peripancreatic fluid or inflammatory changes. Spleen:  Unremarkable. Adrenals/Urinary Tract: Bilateral kidneys and bilateral adrenal glands are normal in appearance. No hydroureteronephrosis in the visualized portions of the abdomen. Stomach/Bowel: Visualized portions are unremarkable. Vascular/Lymphatic: No aneurysm identified in the visualized abdominal vasculature. No lymphadenopathy noted visualized portions of the abdomen. Other: No significant volume of ascites noted in the visualized portions of the peritoneal cavity. Musculoskeletal: No aggressive appearing osseous lesions are noted in the visualized portions of the skeleton. IMPRESSION: 1. Study is positive for choledocholithiasis. 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 Common bile duct is mildly dilated measuring 13 mm in the porta hepatis, with mild intrahepatic biliary ductal dilatation. 2. Excessive hepatic iron deposition noted. Electronically Signed   By: DVinnie LangtonM.D.   On: 07/09/2017 13:36   UKoreaAbdomen Limited Ruq  Result Date: 07/09/2017 CLINICAL DATA:  Elevated liver enzymes EXAM: ULTRASOUND ABDOMEN LIMITED RIGHT UPPER QUADRANT COMPARISON:  CT abdomen and pelvis June 30, 2017 FINDINGS: Gallbladder: Surgically absent. Common bile duct: Diameter: 9 mm, within normal limits for post cholecystectomy state. No biliary duct mass or calculus evident. There is slight intrahepatic biliary duct dilatation. Liver: No focal lesion identified. Within normal limits in parenchymal echogenicity. Portal vein is patent on color Doppler imaging with normal direction of blood  flow towards the liver. IMPRESSION: Gallbladder absent. Mild intrahepatic and extrahepatic biliary duct prominence which may be within normal limits for post cholecystectomy state. Study otherwise unremarkable. Electronically Signed   By: WLowella GripIII M.D.   On: 07/09/2017 09:14    IMPRESSION:   *  Symptomatic choledocholithiasis with bouts of abdominal pain and rising, elevated LFTs.  *  Incidental finding of excessive hepatic iron deposition on CT scan.  No fm or personal hx hemachromatosis. no personal hx of blood  transfusions or po iron supplements.      PLAN:     *  ERCP is set up for 1 PM tomorrow. She can have clear liquids for the rest of today and will be NPO after midnight.  *  Discontinued Lovenox and will order PAS hose.  Encouraged patient to walk in order to prevent DVTs.   Sarah Gribbin  07/09/2017, 2:30 PM Pager: [phone]    [facility] Attending   I have taken an interval history, reviewed the chart and examined the patient. I agree with the Advanced Practitioner's note, impression and recommendations.   Check fasting ferritin tomorrow.  The risks and benefits as well as alternatives of endoscopic procedure(s) have been discussed and reviewed. All questions answered. The patient agrees to proceed.  .Geneive Sandstrom E. Kruti Horacek, MD, FACG [facility] [phone] (pager) 07/09/2017 6:00 PM    

## 2017-07-10 NOTE — Op Note (Addendum)
[facility] Patient Name: Sabrina Ho Procedure Date : 07/10/2017 MRN: [id number] Attending MD: Gatha Mayer , MD Date of Birth: 1961/05/30 CSN: [id number] Age: 56 Admit Type: Inpatient Procedure:                ERCP Indications:              Suspected bile duct stone(s) MRCP + and stricture                            of distal CBD Providers:                Gatha Mayer, MD, Burtis Junes, RN, Elspeth Cho                            Tech., Technician, Raphael Gibney, CRNA Referring MD:              Medicines:                Propofol per Anesthesia, General Anesthesia, Cipro                            834 mg IV Complications:            No immediate complications. Estimated blood loss:                            Minimal. Estimated Blood Loss:     Estimated blood loss was minimal. Procedure:                Pre-Anesthesia Assessment:                           - Prior to the procedure, a History and Physical                            was performed, and patient medications and                            allergies were reviewed. The patient's tolerance of                            previous anesthesia was also reviewed. The risks                            and benefits of the procedure and the sedation                            options and risks were discussed with the patient.                            All questions were answered, and informed consent                            was obtained. Prior Anticoagulants: The patient has  taken no previous anticoagulant or antiplatelet                            agents. ASA Grade Assessment: II - A patient with                            mild systemic disease. After reviewing the risks                            and benefits, the patient was deemed in                            satisfactory condition to undergo the procedure.                           After obtaining informed consent, the scope was                             passed under direct vision. Throughout the                            procedure, the patient's blood pressure, pulse, and                            oxygen saturations were monitored continuously. The                            [id number]) scope was introduced through                            the mouth, and used to inject contrast into and                            used for direct visualization of the bile duct. The                            ERCP was accomplished without difficulty. The                            patient tolerated the procedure well. Scope In: Scope Out: Findings:      A scout film of the abdomen was obtained. Surgical clips, consistent       with a previous cholecystectomy, were seen in the area of the right       upper quadrant of the abdomen. the duodenal scope was inserted,       esophagus not seen well due to side-viewing nature. Stomach grossly       normal. In the duodenum there was an enlarged a deformed papilla with a       smooth benign look to it. The superior aspect above the orifice bulged       and seemed enlarged. Bile was seen in the stomach and duodenum.       Sphincterotome was inserted and wire inserted with some mild difficulty       a was able to gain access  to the bile duct with the wire. Contrast       injection revealed filling of the bile duct and pancreatic duct       simultaneously suggesting a distal common channel. Sphincterotomy was       performed. I thought I had an adequate zinc dry to me so an exchange to       a balloon, the bile duct was dilated and filled with contrast and there       was a transient filling defect suspected. The pancreatic duct itself       looked normal though slightly underfilled. Cholecystectomy clips in       place on the cystic duct. I tried to insert the balloon but had to       extend the sphincterotomy further. The balloon would hang up, an area of       suspected very  distal or papillary stricture. I extended the       sphincterotomy and then the balloon was inserted and balloon sweeps       failed to reveal a stone though there was a filling defect suspicious       for 1 and the MRCP filling defect was very suspicious for. I could never       see if a stone was extracted.spyglass cholangioscopy was then performed       to evaluate and no stone was seen. The abnormality was in the       extraductal portion of the papilla as best I could see a could not see       any abnormalbile duct. I then took biopsies of the abnormal papilla       status post sphincterotomy.I think this enlargement of the papilla was       causing papillary stenosis Impression:               - Biliary papillary stenosis. This was treated with                            biliary sphincterotomy.                           question if there was a bile duct stone that I                            removed but did not see, balloon sweep and                            occlusion cholangiogram and cholangioscopy                            negative. MRCP films were pretty convincing for a                            stone.                           Enlarged papilla unclearcause. It looks benign but                            abnormal. Biopsies taken and pending. Recommendation:           -  Return patient to hospital ward for ongoing care.                            Labs in AM. LR bolus and infusion to reduce risk of                            pancreatitis If well tomorrow can go home                           note that this patient had abnormal liver                            appearance on MRI that suggested increased iron                            deposition. Ferritin was 362. Fasting level.                            Fasting iron saturation was 86%. I will check a                            hemochromatosis gene test. She may need liver                            biopsy perhaps. She may  follow this up with Dr.                            Earlean Shawl. Procedure Code(s):        --- Professional ---                           [phone], Endoscopic retrograde                            cholangiopancreatography (ERCP); with                            sphincterotomy/papillotomy                           [id number], Endoscopic cannulation of papilla with                            direct visualization of pancreatic/common bile                            duct(s) (List separately in addition to code(s) for                            primary procedure) Diagnosis Code(s):        --- Professional ---                           K83.1, Obstruction of bile duct CPT copyright 2016 American [employer] Association. All rights reserved. The codes documented in this report  are preliminary and upon coder review may  be revised to meet current compliance requirements. Gatha Mayer, MD 07/10/2017 2:42:50 PM This report has been signed electronically. Number of Addenda: 0

## 2017-07-10 NOTE — Anesthesia Procedure Notes (Signed)
Procedure Name: Intubation Date/Time: 07/10/2017 1:17 PM Performed by: Ollen Bowl Pre-anesthesia Checklist: Patient identified, Emergency Drugs available, Suction available and Patient being monitored Patient Re-evaluated:Patient Re-evaluated prior to induction Oxygen Delivery Method: Circle system utilized Preoxygenation: Pre-oxygenation with 100% oxygen Induction Type: IV induction Ventilation: Mask ventilation without difficulty Laryngoscope Size: Mac and 3 Grade View: Grade II Tube type: Oral Tube size: 7.5 mm Number of attempts: 1 Airway Equipment and Method: Patient positioned with wedge pillow and Stylet Placement Confirmation: ETT inserted through vocal cords under direct vision,  positive ETCO2 and breath sounds checked- equal and bilateral Secured at: 21 cm Tube secured with: Tape Dental Injury: Teeth and Oropharynx as per pre-operative assessment

## 2017-07-11 DIAGNOSIS — Z881 Allergy status to other antibiotic agents status: Secondary | ICD-10-CM

## 2017-07-11 LAB — COMPREHENSIVE METABOLIC PANEL
ALT: 197 U/L — AB (ref 14–54)
AST: 72 U/L — AB (ref 15–41)
Albumin: 3.1 g/dL — ABNORMAL LOW (ref 3.5–5.0)
Alkaline Phosphatase: 152 U/L — ABNORMAL HIGH (ref 38–126)
Anion gap: 8 (ref 5–15)
BUN: 6 mg/dL (ref 6–20)
CHLORIDE: 104 mmol/L (ref 101–111)
CO2: 28 mmol/L (ref 22–32)
CREATININE: 0.75 mg/dL (ref 0.44–1.00)
Calcium: 9.1 mg/dL (ref 8.9–10.3)
GFR calc Af Amer: >60 mL/min (ref >60)
GLUCOSE: 95 mg/dL (ref 65–99)
POTASSIUM: 3.8 mmol/L (ref 3.5–5.1)
SODIUM: 140 mmol/L (ref 135–145)
Total Bilirubin: 1.2 mg/dL (ref 0.3–1.2)
Total Protein: 5.5 g/dL — ABNORMAL LOW (ref 6.5–8.1)

## 2017-07-11 LAB — CBC
HEMATOCRIT: 38.6 % (ref 36.0–46.0)
Hemoglobin: 12.4 g/dL (ref 12.0–15.0)
MCH: 31.6 pg (ref 26.0–34.0)
MCHC: 32.1 g/dL (ref 30.0–36.0)
MCV: 98.5 fL (ref 78.0–100.0)
Platelets: 202 10*3/uL (ref 150–400)
RBC: 3.92 MIL/uL (ref 3.87–5.11)
RDW: 12.9 % (ref 11.5–15.5)
WBC: 5.9 10*3/uL (ref 4.0–10.5)

## 2017-07-11 LAB — LIPASE, BLOOD: Lipase: 25 U/L (ref 11–51)

## 2017-07-11 MED ORDER — ACETAMINOPHEN 325 MG PO TABS: 650.0000 mg | ORAL_TABLET | Freq: Four times a day (QID) | ORAL | Status: DC | PRN

## 2017-07-11 NOTE — Progress Notes (Signed)
   Subjective:  Patient seen and examined. She tolerated ERCP well. Denies abdominal pain, nausea, or vomiting. Denies fevers or chills. Denies shortness of breath or chest discomfort.   Objective:  Vital signs in last 24 hours: Vitals:   07/10/17 1504 07/10/17 1520 07/10/17 2143 07/11/17 0458  BP: (!) 121/45 (!) 122/44 (!) 114/44 (!) 106/43  Pulse:  61 66 71  Resp: (!) 22 20 18 18   Temp:  98.7 F (37.1 C) 99.2 F (37.3 C) 98.4 F (36.9 C)  TempSrc:  Oral Oral Oral  SpO2: 100% 100% 97% 98%  Weight:      Height:       General: Laying in bed comfortably, NAD HEENT: [place]/AT, EOMI, no scleral icterus, PERRL Cardiac: RRR, No R/M/G appreciated Pulm: normal effort, CTAB Abd: soft, non tender, non distended, BS normal Ext: extremities well perfused, no peripheral edema Neuro: alert and oriented X3, cranial nerves II-XII grossly intact   Assessment/Plan:  Active Problems:   Transaminitis   Choledocholithiasis   Bile duct stricture   Abnormal blood level of iron  Papillary Stenosis, Choledocolithiasis Patient afebrile, normotensive, and asymptomatic. ERCP with biliary papillary stenosis treated with biliary sphincterotomy and biopsies taken, no bilary stones appreciated. MRCP positive for choledocholithiasis with CBD stricture and mild dilation and mild intrahepatic biliary ductal dilatation, also with excessive hepatic iron deposition noted. LFTs trending down; AST 72, ALT 197, T bili 1.2. Lipase WNL. Ferritin 362, Sat ratios 86%, iron 266. Hemachromatosis DNA-PCR obtained, and pending.  -D/c today -No ASA/NSAIDs for 1 week to lower risk of post sphincterotomy bleeding -Tylenol PRN for discomfort -Follow up with Dr. Earlean Shawl    Dispo: Anticipated discharge in approximately 1-2  day(s).   Melanee Spry, MD 07/11/2017, 11:01 AM Pager: [phone]

## 2017-07-11 NOTE — Progress Notes (Signed)
Discussed with the patient AVS discharge medications and instructions and all questioned fully answered. She will call MD if any problems arise and will call to make follow-up appointment with Dr. Earlean Shawl. PIV removed intact, dsg secured w/ no ss of bleeding.   Pt left with staff via w/c without complaints and no acute distress.

## 2017-07-11 NOTE — Anesthesia Postprocedure Evaluation (Signed)
Anesthesia Post Note  Patient: Sabrina Ho  Procedure(s) Performed: ENDOSCOPIC RETROGRADE CHOLANGIOPANCREATOGRAPHY (ERCP) (N/A )     Anesthesia Post Evaluation  Last Vitals:  Vitals:   07/10/17 2143 07/11/17 0458  BP: (!) 114/44 (!) 106/43  Pulse: 66 71  Resp: 18 18  Temp: 37.3 C 36.9 C  SpO2: 97% 98%    Last Pain:  Vitals:   07/11/17 0458  TempSrc: Oral  PainSc:                  Lynda Rainwater

## 2017-07-11 NOTE — Progress Notes (Signed)
          Daily Rounding Note  07/11/2017, 9:04 AM  LOS: 2 days   SUBJECTIVE:   Chief complaint: hungry, served clears and AM meal.  No pain.  Throat a little sore.        OBJECTIVE:         Vital signs in last 24 hours:    Temp:  [97.7 F (36.5 C)-99.2 F (37.3 C)] 98.4 F (36.9 C) (10/25 0458) Pulse Rate:  [61-74] 71 (10/25 0458) Resp:  [17-22] 18 (10/25 0458) BP: (106-141)/(37-61) 106/43 (10/25 0458) SpO2:  [97 %-100 %] 98 % (10/25 0458) Last BM Date: 07/06/17 Filed Weights   07/09/17 0330 07/09/17 1136  Weight: 62.6 kg (138 lb) 62.9 kg (138 lb 11.2 oz)   General: looks well, comfortable   Heart: RRR Chest: clear bil.  Breathing non-labored.   Abdomen: soft, NT, ND.  Active BS  Extremities: no CCE Neuro/Psych:  Oriented x  3.  No deficits, no tremors.    Lab Results:  Recent Labs  07/09/17 0350 07/11/17 0756  WBC 5.9 5.9  HGB 14.8 12.4  HCT 43.8 38.6  PLT 249 202   BMET  Recent Labs  07/09/17 0350 07/10/17 0535  NA 138 141  K 4.2 3.8  CL 104 109  CO2 27 27  GLUCOSE 162* 119*  BUN 5* <5*  CREATININE 0.92 0.76  CALCIUM 9.8 8.8*   LFT  Recent Labs  07/09/17 0350 07/10/17 0535  PROT 7.0 5.5*  ALBUMIN 3.9 3.0*  AST 228* 110*  ALT 324* 243*  ALKPHOS 224* 175*  BILITOT 3.5* 1.6*      ASSESMENT:   *  Papillary stenosis.  Symptomatic with severe pain and dilated intra/extrahepatic bile ducts, filling defect suspicious for stone.   ERCP 10/24: papillary stenosis/enlarged, though benign appearing, papilla (biopsied), extensive sphincterotomy, balloon sweeps never produced a stone.    *   MRI that suggested increased iron deposition. Ferritin was 362, Iron saturation 86%.  Hemochromatosis DNA in process. She may need liver biopsy. Dr. Earlean Shawl can follow this up.   PLAN   *  Stop IVF.  Advance to regular diet.  No ASA/NSAIDs for 1 week to lower risk of post sphincterotomy bleeding. Follow up  with Dr Earlean Shawl re: elevated iron levels and pending Hemachromatosis DNA test.   Ok to discharge home.  Tylenol prn for discomfort, pain  Sabrina Ho  07/11/2017, 9:04 AM Pager: [phone]    [facility] Attending   I have taken an interval history, reviewed the chart and examined the patient. I agree with the Advanced Practitioner's note, impression and recommendations.  She is well after ERCP and ok for dc I will f/u pending bxs and lab tests and communicate to her and primary GI MD  Gatha Mayer, MD, [facility] [phone] (pager) 07/11/2017 1:16 PM

## 2017-07-12 NOTE — Progress Notes (Signed)
Please let her know no cancer in the biopsies.  I am waiting on a blood test result - hemochromatosis testing and will call with those results.

## 2017-07-13 ENCOUNTER — Encounter (HOSPITAL_COMMUNITY): Payer: Self-pay | Admitting: Internal Medicine

## 2017-07-13 NOTE — Discharge Summary (Signed)
Name: Sabrina Ho MRN: [id number] DOB: 03-Sep-1961 56 y.o. PCP: Patient, No Pcp Per  Date of Admission: 07/09/2017  7:22 AM Date of Discharge: 07/11/2017 Attending Physician: Lucious Groves, DO  Discharge Diagnosis: 1. Biliary papillary stenosis  Principal Problem:   Bile duct stricture Active Problems:   Transaminitis   Choledocholithiasis   Abnormal blood level of iron   Discharge Medications: Allergies as of 07/11/2017      Reactions   Vancomycin Itching   Itching of scalp   Amoxicillin Rash   Has patient had a PCN reaction causing immediate rash, facial/tongue/throat swelling, SOB or lightheadedness with hypotension: Yes Has patient had a PCN reaction causing severe rash involving mucus membranes or skin necrosis: No Has patient had a PCN reaction that required hospitalization: No Has patient had a PCN reaction occurring within the last 10 years: No If all of the above answers are "NO", then may proceed with Cephalosporin use.      Medication List    STOP taking these medications   ibuprofen 200 MG tablet Commonly known as:  ADVIL,MOTRIN   ondansetron 4 MG disintegrating tablet Commonly known as:  ZOFRAN ODT   oxyCODONE-acetaminophen 5-325 MG tablet Commonly known as:  PERCOCET/ROXICET   traMADol 50 MG tablet Commonly known as:  ULTRAM     TAKE these medications   estradiol 0.05 MG/24HR patch Commonly known as:  VIVELLE-DOT Place 1 patch onto the skin 2 (two) times a week. Applu Sunday and Wednesday   estradiol 0.1 MG/GM vaginal cream Commonly known as:  ESTRACE Place 1 Applicatorful vaginally as needed. symptoms       Disposition and follow-up:   Ms.Sabrina Ho was discharged from [facility] in Good condition.  At the hospital follow up visit please address:  1.   Biliary Papillary Stenosis -GI follow up -biopsies negative for malignancy -post procedure complications, resolution of symptoms  Elevated Iron and Hepatic  Iron deposition  -patient aware of iron panel and MRCP findings -Hemochromatosis PCR/DNA pending - discussion of results when final  2.  Labs / imaging needed at time of follow-up: Liver function tests or Complete metabolic panel  3.  Pending labs/ test needing follow-up: Hemochromatosis DNA-PCR  Follow-up Appointments: Follow-up Information    Medoff, Dellis Filbert, MD. Schedule an appointment as soon as possible for a visit.   Specialty:  Gastroenterology Why:  To be seen in 1-2 weeks Contact information: [address]   [id number] [phone]           Hospital Course by problem list: Principal Problem:   Bile duct stricture Active Problems:   Transaminitis   Choledocholithiasis   Abnormal blood level of iron   1. Biliary Papillary Stenosis [name] was admitted to [facility] and the Internal Medicine Teaching service for suspected choledocholithiasis with nausea, vomiting, and abdominal pain. In the emergency department the patient was hemodynamically stable and labs were remarkable for AST 228, ALT 324, alkaline phosphatase 224, T bili 3.5,  normal lipase and no leukocytosis. UA not suggestive of infection. Right upper quadrant abdominal ultrasound was performed and showed mild intrahepatic and extrahepatic biliary duct prominence. Gastroenterology was consulted and the patient had an MRCP performed, which confirmed a 45mm CBD, distal CBD tapering suggestive of stricture, and a small filling defect consistent with CBD stone. ERCP was performed that revealed biliary papillary stenosis, which was treated with biliary sphincterotomy. Balloon sweeps of the CBD never produced a stone, occlusion cholangiogram and cholangioscopy  were negative. The enlarged papilla appeared benign and biopsies were taken, which were negative for malignancy. She was fluid resuscitated with lactated ringers following the procedure to prevent pancreatitis.   Ms. Wilcock remained  asymptomatic and hemodynamically stable throughout hospitalization. Liver enzymes and bilirubin appropriately trended down. Post procedure lipase was within normal limits.   2. Excessive hepatic iron deposition note on MRCP MRCP with incidental finding of excessive hepatic iron deposition. Iron studies revealed elevated ferritin, iron, and iron saturation ratios (see labs below). Hemachromatosis DNA-PCR was ordered and pending on discharge. These findings were discussed with the patient.   Discharge Vitals:   BP (!) 105/56 (BP Location: Left Arm)   Pulse 75   Temp 98.4 F (36.9 C) (Oral)   Resp 18   Ht 5\' 4"  (1.626 m)   Wt 138 lb 11.2 oz (62.9 kg)   SpO2 98%   BMI 23.81 kg/m   Pertinent Labs, Studies, and Procedures:   CMP Latest Ref Rng & Units 07/11/2017 07/10/2017 07/09/2017  Glucose 65 - 99 mg/dL 95 119(H) 162(H)  BUN 6 - 20 mg/dL 6 <5(L) 5(L)  Creatinine 0.44 - 1.00 mg/dL 0.75 0.76 0.92  Sodium 135 - 145 mmol/L 140 141 138  Potassium 3.5 - 5.1 mmol/L 3.8 3.8 4.2  Chloride 101 - 111 mmol/L 104 109 104  CO2 22 - 32 mmol/L 28 27 27   Calcium 8.9 - 10.3 mg/dL 9.1 8.8(L) 9.8  Total Protein 6.5 - 8.1 g/dL 5.5(L) 5.5(L) 7.0  Total Bilirubin 0.3 - 1.2 mg/dL 1.2 1.6(H) 3.5(H)  Alkaline Phos 38 - 126 U/L 152(H) 175(H) 224(H)  AST 15 - 41 U/L 72(H) 110(H) 228(H)  ALT 14 - 54 U/L 197(H) 243(H) 324(H)   CBC Latest Ref Rng & Units 07/11/2017 07/09/2017 06/30/2017  WBC 4.0 - 10.5 K/uL 5.9 5.9 10.2  Hemoglobin 12.0 - 15.0 g/dL 12.4 14.8 13.9  Hematocrit 36.0 - 46.0 % 38.6 43.8 40.5  Platelets 150 - 400 K/uL 202 249 255   Iron/TIBC/Ferritin/ %Sat    Component Value Date/Time   IRON 266 (H) 07/10/2017 0535   TIBC 309 07/10/2017 0535   FERRITIN 362 (H) 07/10/2017 0535   IRONPCTSAT 86 (H) 07/10/2017 0535   Lipase     Component Value Date/Time   LIPASE 25 07/11/2017 0756  MR ABDOMEN MRCP W WO CONTAST FINDINGS: Lower chest: Unremarkable.  Hepatobiliary: Diffuse loss of signal  intensity on in-phase dual echo images, and diffuse low signal intensity on T2 weighted sequences, indicative of significant iron deposition in the hepatic parenchyma. No suspicious cystic or solid hepatic lesions. MRCP images demonstrate some very mild intrahepatic biliary ductal dilatation. Common bile duct is dilated up to 13 mm in the porta hepatis. The distal common bile duct smoothly tapers to a diminutive caliber immediately before the level of the ampulla, suggesting a stricture of the distal common bile duct. In the mid to distal common bile duct there is a small filling defect measuring 5 mm (image 26 of series 6), compatible with retained choledocholithiasis. Status post cholecystectomy.  Pancreas: No pancreatic mass. No pancreatic ductal dilatation. No pancreatic or peripancreatic fluid or inflammatory changes. Spleen:  Unremarkable. Adrenals/Urinary Tract: Bilateral kidneys and bilateral adrenal glands are normal in appearance. No hydroureteronephrosis in the visualized portions of the abdomen. Stomach/Bowel: Visualized portions are unremarkable. Vascular/Lymphatic: No aneurysm identified in the visualized abdominal vasculature. No lymphadenopathy noted visualized portions of the abdomen. Other: No significant volume of ascites noted in the visualized portions of the peritoneal cavity.  Musculoskeletal: No aggressive appearing osseous lesions are noted in the visualized portions of the skeleton. IMPRESSION: 1. Study is positive for choledocholithiasis. 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 Common bile duct is mildly dilated measuring 13 mm in the porta hepatis, with mild intrahepatic biliary ductal dilatation. 2. Excessive hepatic iron deposition noted.  ERCP IMPRESSION: 1. ERCP with biliary sweeping and presumed sphincterotomy as above. 2. Completion image demonstrates a nonocclusive filling defect within the distal aspect of the  CBD, potentially representative of an air bubble though nonocclusive choledocholithiasis could have a similar appearance. Correlation with the operative report is recommended. These images were submitted for radiologic interpretation only. Please see the procedural report for the amount of contrast and the fluoroscopy time utilized.  Surgical Pathology- Final Diagnosis Duodenum, Biopsy, Papilla of CBD - DUODENAL MUCOSA WITH HYPEREMIA AND FOCAL FIBROSIS. - NO ADENOMATOUS CHANGE OR MALIGNANCY.  Discharge Instructions: Discharge Instructions    Call MD for:  difficulty breathing, headache or visual disturbances    Complete by:  As directed    Call MD for:  extreme fatigue    Complete by:  As directed    Call MD for:  hives    Complete by:  As directed    Call MD for:  persistant dizziness or light-headedness    Complete by:  As directed    Call MD for:  persistant nausea and vomiting    Complete by:  As directed    Call MD for:  persistant nausea and vomiting    Complete by:  As directed    Call MD for:  redness, tenderness, or signs of infection (pain, swelling, redness, odor or green/yellow discharge around incision site)    Complete by:  As directed    Call MD for:  severe uncontrolled pain    Complete by:  As directed    Call MD for:  severe uncontrolled pain    Complete by:  As directed    Call MD for:  temperature >100.4    Complete by:  As directed    Call MD for:  temperature >100.4    Complete by:  As directed    Diet - low sodium heart healthy    Complete by:  As directed    Diet - low sodium heart healthy    Complete by:  As directed    Discharge instructions    Complete by:  As directed    Ms. Marcelline Mates,   Ms. Gironda,   The procedure you had called ERCP went well. Dr. Carlean Purl was unable to see a stone, but did note that your biliary papilla was enlarged and narrowed. He made an incision in this area to make the opening larger. He also took a biopsy, which takes  several days to result. Please take tylenol as needed for pain. Avoid aspirin, ibuprofen, aleve, or other NSAIDS for one week after your procedure.   Your liver enzymes and bilirubin, which had been elevated are starting to come down to normal levels.   On imaging of you liver they found increased iron deposition. Blood tests showed that the iron levels in your body were higher than normal. Sometimes high iron levels and iron deposition in the liver are seen in a condition called Hemochromatosis. A test was sent to the lab to screen for this condition.  Please follow up with Dr. Earlean Shawl to discuss the results, as well as the results of the biopsy taken during  your ERCP. I would suggest follow up within 1 week of discharge from the hospital.   Discharge instructions    Complete by:  As directed    Please avoid taking ibuprofen or naproxen for the next 2-3 days to ensure healing of your procedure.  Please follow up with Dr. Earlean Shawl in 1-2 weeks; your biopsy results should be back by then and he will be able to access them. Please get evaluated if you have fevers, chills, return of abdominal pain, nausea, vomiting.   Increase activity slowly    Complete by:  As directed    Increase activity slowly    Complete by:  As directed       Signed: Melanee Spry, MD 07/13/2017, 8:26 AM   Pager: [phone]

## 2017-07-15 LAB — HEMOCHROMATOSIS DNA-PCR(C282Y,H63D)

## 2017-07-16 NOTE — Progress Notes (Signed)
So she has hemochromatosis  Needs to f/u reg GI MD with this - Dr. Earlean Shawl is her GI  1) Please fax this report, ERCP report, and path report from ERCP to Dr. Earlean Shawl 2) Let me know if she has any ?

## 2017-07-22 DIAGNOSIS — L570 Actinic keratosis: Secondary | ICD-10-CM | POA: Diagnosis not present

## 2017-07-22 DIAGNOSIS — D2261 Melanocytic nevi of right upper limb, including shoulder: Secondary | ICD-10-CM | POA: Diagnosis not present

## 2017-07-22 DIAGNOSIS — D2262 Melanocytic nevi of left upper limb, including shoulder: Secondary | ICD-10-CM | POA: Diagnosis not present

## 2017-07-22 DIAGNOSIS — D692 Other nonthrombocytopenic purpura: Secondary | ICD-10-CM | POA: Diagnosis not present

## 2017-07-22 DIAGNOSIS — Z85828 Personal history of other malignant neoplasm of skin: Secondary | ICD-10-CM | POA: Diagnosis not present

## 2017-08-26 ENCOUNTER — Encounter (HOSPITAL_COMMUNITY): Payer: BLUE CROSS/BLUE SHIELD

## 2017-08-30 ENCOUNTER — Other Ambulatory Visit (HOSPITAL_COMMUNITY): Payer: Self-pay | Admitting: *Deleted

## 2017-09-02 ENCOUNTER — Ambulatory Visit (HOSPITAL_COMMUNITY)
Admission: RE | Admit: 2017-09-02 | Discharge: 2017-09-02 | Disposition: A | Discharge status: Home / Self Care | Payer: BLUE CROSS/BLUE SHIELD | Source: Ambulatory Visit | Attending: Gastroenterology | Admitting: Gastroenterology

## 2017-09-02 LAB — HEMOGLOBIN AND HEMATOCRIT, BLOOD
HCT: 44.7 % (ref 36.0–46.0)
Hemoglobin: 15.3 g/dL — ABNORMAL HIGH (ref 12.0–15.0)

## 2017-09-23 ENCOUNTER — Other Ambulatory Visit: Payer: Self-pay | Admitting: Gynecology

## 2017-09-23 DIAGNOSIS — Z139 Encounter for screening, unspecified: Secondary | ICD-10-CM

## 2017-09-30 ENCOUNTER — Encounter (HOSPITAL_COMMUNITY): Payer: BLUE CROSS/BLUE SHIELD

## 2017-10-04 ENCOUNTER — Encounter (HOSPITAL_COMMUNITY)
Admission: RE | Admit: 2017-10-04 | Discharge: 2017-10-04 | Disposition: A | Discharge status: Home / Self Care | Payer: BLUE CROSS/BLUE SHIELD | Source: Ambulatory Visit | Attending: Gastroenterology | Admitting: Gastroenterology

## 2017-10-04 LAB — POCT HEMOGLOBIN-HEMACUE: Hemoglobin: 13.2 g/dL (ref 12.0–15.0)

## 2017-10-10 ENCOUNTER — Ambulatory Visit
Admission: RE | Admit: 2017-10-10 | Discharge: 2017-10-10 | Disposition: A | Discharge status: Home / Self Care | Payer: BLUE CROSS/BLUE SHIELD | Source: Ambulatory Visit | Attending: Gynecology | Admitting: Gynecology

## 2017-10-10 DIAGNOSIS — Z139 Encounter for screening, unspecified: Secondary | ICD-10-CM

## 2017-10-10 DIAGNOSIS — Z1231 Encounter for screening mammogram for malignant neoplasm of breast: Secondary | ICD-10-CM | POA: Diagnosis not present

## 2017-10-14 ENCOUNTER — Other Ambulatory Visit: Payer: Self-pay | Admitting: Gynecology

## 2017-10-14 DIAGNOSIS — R928 Other abnormal and inconclusive findings on diagnostic imaging of breast: Secondary | ICD-10-CM

## 2017-10-16 ENCOUNTER — Other Ambulatory Visit: Payer: BLUE CROSS/BLUE SHIELD

## 2017-10-18 ENCOUNTER — Ambulatory Visit
Admission: RE | Admit: 2017-10-18 | Discharge: 2017-10-18 | Disposition: A | Discharge status: Home / Self Care | Payer: BLUE CROSS/BLUE SHIELD | Source: Ambulatory Visit | Attending: Gynecology | Admitting: Gynecology

## 2017-10-18 DIAGNOSIS — R922 Inconclusive mammogram: Secondary | ICD-10-CM | POA: Diagnosis not present

## 2017-10-18 DIAGNOSIS — R928 Other abnormal and inconclusive findings on diagnostic imaging of breast: Secondary | ICD-10-CM

## 2017-10-18 DIAGNOSIS — N6011 Diffuse cystic mastopathy of right breast: Secondary | ICD-10-CM | POA: Diagnosis not present

## 2017-10-31 ENCOUNTER — Other Ambulatory Visit (HOSPITAL_COMMUNITY): Payer: Self-pay

## 2017-11-01 ENCOUNTER — Encounter (HOSPITAL_COMMUNITY)
Admission: RE | Admit: 2017-11-01 | Discharge: 2017-11-01 | Disposition: A | Discharge status: Home / Self Care | Payer: BLUE CROSS/BLUE SHIELD | Source: Ambulatory Visit | Attending: Gastroenterology | Admitting: Gastroenterology

## 2017-11-01 LAB — HEMOGLOBIN AND HEMATOCRIT, BLOOD
HCT: 39.8 % (ref 36.0–46.0)
Hemoglobin: 13.4 g/dL (ref 12.0–15.0)

## 2017-11-01 NOTE — Progress Notes (Signed)
Pt came for therapeutic phlebotomy which was removed from left antecubital area. Pt tolerated well, VSS pre and post. 500 ml removed after H/H drawn and sent as ordered by MD.

## 2017-11-06 DIAGNOSIS — J0100 Acute maxillary sinusitis, unspecified: Secondary | ICD-10-CM | POA: Diagnosis not present

## 2017-11-27 DIAGNOSIS — J209 Acute bronchitis, unspecified: Secondary | ICD-10-CM | POA: Diagnosis not present

## 2017-11-27 DIAGNOSIS — J0100 Acute maxillary sinusitis, unspecified: Secondary | ICD-10-CM | POA: Diagnosis not present

## 2017-11-29 ENCOUNTER — Encounter (HOSPITAL_COMMUNITY): Payer: BLUE CROSS/BLUE SHIELD

## 2017-12-06 DIAGNOSIS — Z8349 Family history of other endocrine, nutritional and metabolic diseases: Secondary | ICD-10-CM | POA: Diagnosis not present

## 2017-12-09 ENCOUNTER — Encounter: Payer: Self-pay | Admitting: Family Medicine

## 2017-12-09 ENCOUNTER — Ambulatory Visit: Payer: BLUE CROSS/BLUE SHIELD | Admitting: Family Medicine

## 2017-12-09 VITALS — BP 120/70 | HR 75 | Temp 97.9°F | Resp 16 | Ht 64.25 in | Wt 135.6 lb

## 2017-12-09 DIAGNOSIS — R058 Other specified cough: Secondary | ICD-10-CM

## 2017-12-09 DIAGNOSIS — Z7689 Persons encountering health services in other specified circumstances: Secondary | ICD-10-CM

## 2017-12-09 DIAGNOSIS — R05 Cough: Secondary | ICD-10-CM

## 2017-12-09 NOTE — Progress Notes (Signed)
   Subjective:    Patient ID: Sabrina Ho, female    DOB: [birth year], 57 y.o.   MRN: [id number]  HPI Chief Complaint  Patient presents with  . new pt    new pt cough for a couple weeks, feels better since saturday. no other concerns   She is new to the practice and here to establish care.  Previous medical care: no PCP  OB/GYN- Dr. Lesia Hausen [facility] Dr. Earlean Shawl is her GI and has been treating her for hemochromatosis.   She reports a 3 month history of cough that was preceded by a viral URI. States she was evaluated at an UC on 2 separate occasions for cough and congestion and has taken 2 rounds of antibiotics. The last antibiotic was a 10 day course of levofloxacin. States she completed this last week and feels approximately 75% improved.  States she had a negative chest XR at this UC. No records are online or available.  Denies history of asthma, COPD, chronic bronchitis.  Former smoker and stopped many years ago.   Denies history of underlying allergies or GERD.   Denies fever, chills, fatigue, unexplained weight loss, dizziness, chest pain, palpitations, shortness of breath, abdominal pain, N/V/D, urinary symptoms, LE edema.   Stopped smoking 17 years ago.  Works as an Optometrist.    Married with 2 kids.   Reviewed allergies, medications, past medical, surgical, family, and social history.   Review of Systems Pertinent positives and negatives in the history of present illness.     Objective:   Physical Exam BP 120/70   Pulse 75   Temp 97.9 F (36.6 C) (Oral)   Resp 16   Ht 5' 4.25" (1.632 m)   Wt 135 lb 9.6 oz (61.5 kg)   SpO2 98%   BMI 23.09 kg/m   Alert and in no distress.  Pharyngeal area is normal. Neck is supple without adenopathy or thyromegaly. Cardiac exam shows a regular sinus rhythm without murmurs or gallops. Lungs are clear to auscultation. Extremities without edema.       Assessment & Plan:  Cough present for greater than 3  weeks  Hemochromatosis, unspecified hemochromatosis type  Encounter to establish care  She appears much improved since the onset of her illness in January.  She is under the care of Dr. Earlean Shawl for hemochromatosis and reports this has been stable.  Will attempt to get records from the UC where she was evaluated and treated recently. The computers were down most of the visit with the patient.  Will have her follow up if she does not continue to improve or when she is due for a fasting CPE.

## 2017-12-12 DIAGNOSIS — Z130 Encounter for screening for diseases of the blood and blood-forming organs and certain disorders involving the immune mechanism: Secondary | ICD-10-CM | POA: Diagnosis not present

## 2017-12-12 DIAGNOSIS — Z780 Asymptomatic menopausal state: Secondary | ICD-10-CM | POA: Diagnosis not present

## 2017-12-12 DIAGNOSIS — Z01419 Encounter for gynecological examination (general) (routine) without abnormal findings: Secondary | ICD-10-CM | POA: Diagnosis not present

## 2017-12-12 DIAGNOSIS — Z1389 Encounter for screening for other disorder: Secondary | ICD-10-CM | POA: Diagnosis not present

## 2017-12-12 DIAGNOSIS — Z79890 Hormone replacement therapy (postmenopausal): Secondary | ICD-10-CM | POA: Diagnosis not present

## 2018-01-06 NOTE — Progress Notes (Signed)
Subjective:    Patient ID: Sabrina Ho, female    DOB: 02-12-1961, 57 y.o.   MRN: [id number]  HPI Chief Complaint  Patient presents with  . fasting cpe    fasting cpe. eye exam appt next month, sees obgyn for pelvic exams. no concerns   She is here for a complete physical exam.  Other providers: Dr. Morene Rankins, Dr. Tera Helper [facility] , Dermatologist  Social history: Lives with her husband and son, works Optometrist  Denies smoking, drinking alcohol, drug use.  Former smoker with a 25-pack-year history.  She stopped smoking 17 years ago.  Asymptomatic.  Diet: fairly healthy Excerise: walks daily   Immunizations: Tdap overdue, would like shingrix- she checked with her insurance company.   Health maintenance:  Mammogram: 10/18/2017 Colonoscopy: 2-3 years ago and had polyps per patient.  Last Gynecological Exam: Hysterectomy.  She has an OB/GYN. Last Dental Exam: twice annually  Last Eye Exam: annually, appt next month   Wears seatbelt always, uses sunscreen, smoke detectors in home and functioning, does not text while driving and feels safe in home environment.   Reviewed allergies, medications, past medical, surgical, family, and social history.   Review of Systems Review of Systems Constitutional: -fever, -chills, -sweats, -unexpected weight change,-fatigue ENT: -runny nose, -ear pain, -sore throat Cardiology:  -chest pain, -palpitations, -edema Respiratory: -cough, -shortness of breath, -wheezing Gastroenterology: -abdominal pain, -nausea, -vomiting, -diarrhea, -constipation  Hematology: -bleeding or bruising problems Musculoskeletal: -arthralgias, -myalgias, -joint swelling, -back pain Ophthalmology: -vision changes Urology: -dysuria, -difficulty urinating, -hematuria, -urinary frequency, -urgency Neurology: -headache, -weakness, -tingling, -numbness       Objective:   Physical Exam BP 110/70   Pulse 65   Ht 5' 3.5" (1.613 m)   Wt 135 lb (61.2 kg)   BMI 23.54  kg/m   General Appearance:    Alert, cooperative, no distress, appears stated age  Head:    Normocephalic, without obvious abnormality, atraumatic  Eyes:    PERRL, conjunctiva/corneas clear, EOM's intact, fundi    benign  Ears:    Normal TM's and external ear canals  Nose:   Nares normal, mucosa normal, no drainage or sinus   tenderness  Throat:   Lips, mucosa, and tongue normal; teeth and gums normal  Neck:   Supple, no lymphadenopathy;  thyroid:  no   enlargement/tenderness/nodules; no carotid   bruit or JVD  Back:    Spine nontender, no curvature, ROM normal, no CVA     tenderness  Lungs:     Clear to auscultation bilaterally without wheezes, rales or     ronchi; respirations unlabored  Chest Wall:    No tenderness or deformity   Heart:    Regular rate and rhythm, S1 and S2 normal, no murmur, rub   or gallop  Breast Exam:   Done at OB/GYN office.mammogram up-to-date  Abdomen:     Soft, non-tender, nondistended, normoactive bowel sounds,    no masses, no hepatosplenomegaly  Genitalia:   Done at OB/GYN  Rectal:    Normal tone, no masses or tenderness; guaiac negative stool  Extremities:   No clubbing, cyanosis or edema  Pulses:   2+ and symmetric all extremities  Skin:   Skin color, texture, turgor normal, no rashes or lesions  Lymph nodes:   Cervical, supraclavicular, and axillary nodes normal  Neurologic:   CNII-XII intact, normal strength, sensation and gait; reflexes 2+ and symmetric throughout          Psych:   Normal mood, affect,  hygiene and grooming.    Urinalysis dipstick: trace blood on dipstick (sent for microscopy)        Assessment & Plan:  Routine general medical examination at a health care facility - Plan: POCT Urinalysis DIP (Proadvantage Device), CBC with Differential/Platelet, Comprehensive metabolic panel, TSH, Lipid panel  Need for hepatitis C screening test - Plan: Hepatitis C antibody  Need for Tdap vaccination - Plan: Tdap vaccine greater than or equal  to 7yo IM  Hematuria, unspecified type - Plan: Urine Microscopic, CANCELED: POCT UA - Microscopic Only  Vaccine counseling  Family history of thyroid disease in mother - Plan: TSH  Screening for lipid disorders - Plan: Lipid panel  She appears to be doing well physically and emotionally. Counseling done on healthy lifestyle with diet and exercise. She continues being under Dr. Liliane Channel care for hemochromatosis Screening done for hepatitis C due to her age group. Immunizations updated.  Tdap given.  Counseling done on all components of vaccine. She would like the shingles vaccine and she did contact her insurance company about this.  We will put her on her list as we are currently out of the vaccine. Mammogram up-to-date. She does get annual skin checks with her dermatologist. I will attempt to get colonoscopy records since this was done outside of [facility].  She appears to be up-to-date. Follow-up pending labs.

## 2018-01-07 ENCOUNTER — Ambulatory Visit: Payer: BLUE CROSS/BLUE SHIELD | Admitting: Family Medicine

## 2018-01-07 ENCOUNTER — Encounter: Payer: Self-pay | Admitting: Family Medicine

## 2018-01-07 VITALS — BP 110/70 | HR 65 | Ht 63.5 in | Wt 135.0 lb

## 2018-01-07 DIAGNOSIS — Z7189 Other specified counseling: Secondary | ICD-10-CM

## 2018-01-07 DIAGNOSIS — Z7185 Encounter for immunization safety counseling: Secondary | ICD-10-CM

## 2018-01-07 DIAGNOSIS — Z0000 Encounter for general adult medical examination without abnormal findings: Secondary | ICD-10-CM

## 2018-01-07 DIAGNOSIS — Z23 Encounter for immunization: Secondary | ICD-10-CM

## 2018-01-07 DIAGNOSIS — R319 Hematuria, unspecified: Secondary | ICD-10-CM

## 2018-01-07 DIAGNOSIS — Z1159 Encounter for screening for other viral diseases: Secondary | ICD-10-CM | POA: Diagnosis not present

## 2018-01-07 DIAGNOSIS — Z8349 Family history of other endocrine, nutritional and metabolic diseases: Secondary | ICD-10-CM | POA: Diagnosis not present

## 2018-01-07 DIAGNOSIS — Z13220 Encounter for screening for lipoid disorders: Secondary | ICD-10-CM | POA: Diagnosis not present

## 2018-01-07 LAB — POCT URINALYSIS DIP (PROADVANTAGE DEVICE)
BILIRUBIN UA: NEGATIVE
BILIRUBIN UA: NEGATIVE mg/dL
Glucose, UA: NEGATIVE mg/dL
Leukocytes, UA: NEGATIVE
Nitrite, UA: NEGATIVE
Protein Ur, POC: NEGATIVE mg/dL
Specific Gravity, Urine: 1.03
Urobilinogen, Ur: NEGATIVE
pH, UA: 6 (ref 5.0–8.0)

## 2018-01-07 NOTE — Patient Instructions (Signed)
You received your Tdap today. We will call you when we have the Shingrix vaccine available.   Continue taking good care of yourself. Make sure you are getting at least 150 minutes of physical activity per week.   We will call you with your lab results.   Preventative Care for Adults, Female       REGULAR HEALTH EXAMS:  A routine yearly physical is a good way to check in with your primary care provider about your health and preventive screening. It is also an opportunity to share updates about your health and any concerns you have, and receive a thorough all-over exam.   Most health insurance companies pay for at least some preventative services.  Check with your health plan for specific coverages.  WHAT PREVENTATIVE SERVICES DO MEN NEED?  Adult men should have their weight and blood pressure checked regularly.   Men age 43 and older should have their cholesterol levels checked regularly.  Beginning at age 75 and continuing to age 34, men should be screened for colorectal cancer.  Certain people should may need continued testing until age 61.  Other cancer screening may include exams for testicular and prostate cancer.  Updating vaccinations is part of preventative care.  Vaccinations help protect against diseases such as the flu.  Lab tests are generally done as part of preventative care to screen for anemia and blood disorders, to screen for problems with the kidneys and liver, to screen for bladder problems, to check blood sugar, and to check your cholesterol level.  Preventative services generally include counseling about diet, exercise, avoiding tobacco, drugs, excessive alcohol consumption, and sexually transmitted infections.    GENERAL RECOMMENDATIONS FOR GOOD HEALTH:  Healthy diet:  Eat a variety of foods, including fruit, vegetables, animal or vegetable protein, such as meat, fish, chicken, and eggs, or beans, lentils, tofu, and grains, such as rice.  Drink plenty of water  daily.  Decrease saturated fat in the diet, avoid lots of red meat, processed foods, sweets, fast foods, and fried foods.  Exercise:  Aerobic exercise helps maintain good heart health. At least 30-40 minutes of moderate-intensity exercise is recommended. For example, a brisk walk that increases your heart rate and breathing. This should be done on most days of the week.   Find a type of exercise or a variety of exercises that you enjoy so that it becomes a part of your daily life.  Examples are running, walking, swimming, water aerobics, and biking.  For motivation and support, explore group exercise such as aerobic class, spin class, Zumba, Yoga,or  martial arts, etc.    Set exercise goals for yourself, such as a certain weight goal, walk or run in a race such as a 5k walk/run.  Speak to your primary care provider about exercise goals.  Disease prevention:  If you smoke or chew tobacco, find out from your caregiver how to quit. It can literally save your life, no matter how long you have been a tobacco user. If you do not use tobacco, never begin.   Maintain a healthy diet and normal weight. Increased weight leads to problems with blood pressure and diabetes.   The Body Mass Index or BMI is a way of measuring how much of your body is fat. Having a BMI above 27 increases the risk of heart disease, diabetes, hypertension, stroke and other problems related to obesity. Your caregiver can help determine your BMI and based on it develop an exercise and dietary program to  help you achieve or maintain this important measurement at a healthful level.  High blood pressure causes heart and blood vessel problems.  Persistent high blood pressure should be treated with medicine if weight loss and exercise do not work.   Fat and cholesterol leaves deposits in your arteries that can block them. This causes heart disease and vessel disease elsewhere in your body.  If your cholesterol is found to be high, or if  you have heart disease or certain other medical conditions, then you may need to have your cholesterol monitored frequently and be treated with medication.   Ask if you should have a stress test if your history suggests this. A stress test is a test done on a treadmill that looks for heart disease. This test can find disease prior to there being a problem.  Avoid drinking alcohol in excess (more than two drinks per day).  Avoid use of street drugs. Do not share needles with anyone. Ask for professional help if you need assistance or instructions on stopping the use of alcohol, cigarettes, and/or drugs.  Brush your teeth twice a day with fluoride toothpaste, and floss once a day. Good oral hygiene prevents tooth decay and gum disease. The problems can be painful, unattractive, and can cause other health problems. Visit your dentist for a routine oral and dental check up and preventive care every 6-12 months.   Look at your skin regularly.  Use a mirror to look at your back. Notify your caregivers of changes in moles, especially if there are changes in shapes, colors, a size larger than a pencil eraser, an irregular border, or development of new moles.  Safety:  Use seatbelts 100% of the time, whether driving or as a passenger.  Use safety devices such as hearing protection if you work in environments with loud noise or significant background noise.  Use safety glasses when doing any work that could send debris in to the eyes.  Use a helmet if you ride a bike or motorcycle.  Use appropriate safety gear for contact sports.  Talk to your caregiver about gun safety.  Use sunscreen with a SPF (or skin protection factor) of 15 or greater.  Lighter skinned people are at a greater risk of skin cancer. Don't forget to also wear sunglasses in order to protect your eyes from too much damaging sunlight. Damaging sunlight can accelerate cataract formation.   Practice safe sex. Use condoms. Condoms are used for  birth control and to help reduce the spread of sexually transmitted infections (or STIs).  Some of the STIs are gonorrhea (the clap), chlamydia, syphilis, trichomonas, herpes, HPV (human papilloma virus) and HIV (human immunodeficiency virus) which causes AIDS. The herpes, HIV and HPV are viral illnesses that have no cure. These can result in disability, cancer and death.   Keep carbon monoxide and smoke detectors in your home functioning at all times. Change the batteries every 6 months or use a model that plugs into the wall.   Vaccinations:  Stay up to date with your tetanus shots and other required immunizations. You should have a booster for tetanus every 10 years. Be sure to get your flu shot every year, since 5%-20% of the [place] population comes down with the flu. The flu vaccine changes each year, so being vaccinated once is not enough. Get your shot in the fall, before the flu season peaks.   Other vaccines to consider:  Pneumococcal vaccine to protect against certain types of pneumonia.  This  is normally recommended for adults age 40 or older.  However, adults younger than 57 years old with certain underlying conditions such as diabetes, heart or lung disease should also receive the vaccine.  Shingles vaccine to protect against Varicella Zoster if you are older than age 51, or younger than 57 years old with certain underlying illness.  Hepatitis A vaccine to protect against a form of infection of the liver by a virus acquired from food.  Hepatitis B vaccine to protect against a form of infection of the liver by a virus acquired from blood or body fluids, particularly if you work in health care.  If you plan to travel internationally, check with your local health department for specific vaccination recommendations.  Cancer Screening:  Most routine colon cancer screening begins at the age of 24. On a yearly basis, doctors may provide special easy to use take-home tests to check for hidden  blood in the stool. Sigmoidoscopy or colonoscopy can detect the earliest forms of colon cancer and is life saving. These tests use a small camera at the end of a tube to directly examine the colon. Speak to your caregiver about this at age 8, when routine screening begins (and is repeated every 5 years unless early forms of pre-cancerous polyps or small growths are found).   At the age of 63 men usually start screening for prostate cancer every year. Screening may begin at a younger age for those with higher risk. Those at higher risk include African-Americans or having a family history of prostate cancer. There are two types of tests for prostate cancer:   Prostate-specific antigen (PSA) testing. Recent studies raise questions about prostate cancer using PSA and you should discuss this with your caregiver.   Digital rectal exam (in which your doctor's lubricated and gloved finger feels for enlargement of the prostate through the anus).   Screening for testicular cancer.  Do a monthly exam of your testicles. Gently roll each testicle between your thumb and fingers, feeling for any abnormal lumps. The best time to do this is after a hot shower or bath when the tissues are looser. Notify your caregivers of any lumps, tenderness or changes in size or shape immediately.

## 2018-01-08 LAB — COMPREHENSIVE METABOLIC PANEL
A/G RATIO: 1.9 (ref 1.2–2.2)
ALT: 12 IU/L (ref 0–32)
AST: 14 IU/L (ref 0–40)
Albumin: 4.3 g/dL (ref 3.5–5.5)
Alkaline Phosphatase: 71 IU/L (ref 39–117)
BILIRUBIN TOTAL: 0.3 mg/dL (ref 0.0–1.2)
BUN / CREAT RATIO: 13 (ref 9–23)
BUN: 11 mg/dL (ref 6–24)
CHLORIDE: 102 mmol/L (ref 96–106)
CO2: 24 mmol/L (ref 20–29)
Calcium: 9.7 mg/dL (ref 8.7–10.2)
Creatinine, Ser: 0.88 mg/dL (ref 0.57–1.00)
GFR calc Af Amer: 84 mL/min/{1.73_m2} (ref >59)
GFR calc non Af Amer: 73 mL/min/{1.73_m2} (ref >59)
GLOBULIN, TOTAL: 2.3 g/dL (ref 1.5–4.5)
Glucose: 89 mg/dL (ref 65–99)
POTASSIUM: 4.4 mmol/L (ref 3.5–5.2)
SODIUM: 140 mmol/L (ref 134–144)
Total Protein: 6.6 g/dL (ref 6.0–8.5)

## 2018-01-08 LAB — CBC WITH DIFFERENTIAL/PLATELET
BASOS: 0 %
Basophils Absolute: 0 10*3/uL (ref 0.0–0.2)
EOS (ABSOLUTE): 0.1 10*3/uL (ref 0.0–0.4)
Eos: 1 %
HEMATOCRIT: 41.4 % (ref 34.0–46.6)
Hemoglobin: 14.4 g/dL (ref 11.1–15.9)
IMMATURE GRANS (ABS): 0 10*3/uL (ref 0.0–0.1)
Immature Granulocytes: 0 %
LYMPHS: 36 %
Lymphocytes Absolute: 2.2 10*3/uL (ref 0.7–3.1)
MCH: 30.8 pg (ref 26.6–33.0)
MCHC: 34.8 g/dL (ref 31.5–35.7)
MCV: 89 fL (ref 79–97)
MONOS ABS: 0.4 10*3/uL (ref 0.1–0.9)
Monocytes: 7 %
NEUTROS ABS: 3.3 10*3/uL (ref 1.4–7.0)
Neutrophils: 56 %
PLATELETS: 270 10*3/uL (ref 150–379)
RBC: 4.67 x10E6/uL (ref 3.77–5.28)
RDW: 13.2 % (ref 12.3–15.4)
WBC: 6 10*3/uL (ref 3.4–10.8)

## 2018-01-08 LAB — URINALYSIS, MICROSCOPIC ONLY: CASTS: NONE SEEN /LPF

## 2018-01-08 LAB — HEPATITIS C ANTIBODY

## 2018-01-08 LAB — LIPID PANEL
CHOL/HDL RATIO: 4.2 ratio (ref 0.0–4.4)
Cholesterol, Total: 180 mg/dL (ref 100–199)
HDL: 43 mg/dL (ref >39)
LDL Calculated: 116 mg/dL — ABNORMAL HIGH (ref 0–99)
TRIGLYCERIDES: 103 mg/dL (ref 0–149)
VLDL Cholesterol Cal: 21 mg/dL (ref 5–40)

## 2018-01-08 LAB — TSH: TSH: 2.4 u[IU]/mL (ref 0.450–4.500)

## 2018-01-13 ENCOUNTER — Telehealth: Payer: Self-pay | Admitting: Family Medicine

## 2018-01-13 NOTE — Telephone Encounter (Signed)
Left message for pt to call. Pt needs to signed a medical records release to received colonoscopy from Dr. Earlean Shawl.

## 2018-01-13 NOTE — Telephone Encounter (Signed)
Received fax from Dr. Earlean Shawl stating pt will need to sign a medical records release as no pcp listed at that facility. Will call pt to advise.

## 2018-01-15 ENCOUNTER — Other Ambulatory Visit (INDEPENDENT_AMBULATORY_CARE_PROVIDER_SITE_OTHER): Payer: BLUE CROSS/BLUE SHIELD

## 2018-01-15 DIAGNOSIS — Z23 Encounter for immunization: Secondary | ICD-10-CM | POA: Diagnosis not present

## 2018-02-05 ENCOUNTER — Telehealth: Payer: Self-pay | Admitting: Family Medicine

## 2018-02-05 NOTE — Telephone Encounter (Signed)
Received requested info from Dr. Earlean Shawl. Sending back for review.

## 2018-02-07 ENCOUNTER — Encounter: Payer: Self-pay | Admitting: Family Medicine

## 2018-02-19 ENCOUNTER — Encounter: Payer: Self-pay | Admitting: Family Medicine

## 2018-03-24 ENCOUNTER — Other Ambulatory Visit (INDEPENDENT_AMBULATORY_CARE_PROVIDER_SITE_OTHER): Payer: BLUE CROSS/BLUE SHIELD

## 2018-03-24 DIAGNOSIS — Z23 Encounter for immunization: Secondary | ICD-10-CM

## 2018-07-22 ENCOUNTER — Encounter: Payer: Self-pay | Admitting: Internal Medicine

## 2018-09-02 ENCOUNTER — Other Ambulatory Visit: Payer: Self-pay | Admitting: Gynecology

## 2018-09-02 DIAGNOSIS — Z1231 Encounter for screening mammogram for malignant neoplasm of breast: Secondary | ICD-10-CM

## 2018-09-03 ENCOUNTER — Encounter (INDEPENDENT_AMBULATORY_CARE_PROVIDER_SITE_OTHER): Payer: Self-pay | Admitting: Physician Assistant

## 2018-09-03 ENCOUNTER — Ambulatory Visit (INDEPENDENT_AMBULATORY_CARE_PROVIDER_SITE_OTHER): Payer: BLUE CROSS/BLUE SHIELD | Admitting: Physician Assistant

## 2018-09-03 DIAGNOSIS — L600 Ingrowing nail: Secondary | ICD-10-CM

## 2018-09-03 NOTE — Progress Notes (Signed)
   Procedure Note  Patient: Sabrina Ho             Date of Birth: 05/24/1961           MRN: [id number]             Visit Date: 09/03/2018 HPI: Sabrina Ho is a 57 year old female comes in today due to right great toe pain and redness.  This is first time we are seen this patient.  She reports that she had the nail cut out of the nail center on Thursday of last week and is now having pain and redness about the toe.  She does get ingrown nails from time to time and has he is cut out the nail salon.  She has had no fevers or chills.  She said no drainage.  Review of systems: Please see HPI otherwise negative  Physical exam General well-developed well-nourished female no acute distress. Psych: Mood and affect appropriate alert and oriented x3 Right foot tenderness along the medial border of the right great toe only.  No expressible purulence.  She has tenderness along the medial border of the right great toe.  Remainder the foot is nontender.  Procedures: Visit Diagnoses: Ingrown nail of great toe of right foot  Nail Removal Date/Time: 09/03/2018 5:04 PM Performed by: Pete Pelt, PA-C Authorized by: Pete Pelt, PA-C   Consent:    Consent obtained:  Verbal   Consent given by:  Patient   Risks discussed:  Bleeding, infection and pain   Alternatives discussed:  Alternative treatment Location:    Foot:  R big toe Pre-procedure details:    Skin preparation:  Betadine Anesthesia (see MAR for exact dosages):    Anesthesia method:  Local infiltration   Local anesthetic:  Lidocaine 1% w/o epi Nail Removal:    Nail removed:  Partial   Nail side:  Medial   Nail bed repaired: no     Removed nail replaced and anchored: no   Trephination:    Subungual hematoma drained: no   Ingrown nail:    Wedge excision of skin: no     Nail matrix removed or ablated:  None Post-procedure details:    Dressing:  4x4 sterile gauze, Xeroform gauze and gauze roll   Patient tolerance of  procedure:  Tolerated well, no immediate complications   Plan: She will leave the dressing on for 24 hours.  Then remove the dressing and start Dial soap soaks for 15 to 20 minutes twice daily tried to completely thereafter and apply Neosporin light bandage.  She will follow-up on as-needed basis pain persist or becomes worse or there is any signs of infection.  Questions were encouraged and answered.

## 2018-10-13 ENCOUNTER — Ambulatory Visit
Admission: RE | Admit: 2018-10-13 | Discharge: 2018-10-13 | Disposition: A | Discharge status: Home / Self Care | Payer: BLUE CROSS/BLUE SHIELD | Source: Ambulatory Visit | Attending: Obstetrics and Gynecology | Admitting: Obstetrics and Gynecology

## 2018-10-13 DIAGNOSIS — Z1231 Encounter for screening mammogram for malignant neoplasm of breast: Secondary | ICD-10-CM | POA: Diagnosis not present

## 2018-12-21 IMAGING — CT CT ABD-PELV W/ CM
2 of 5 series · 16 of 46 positions shown, 18 images · IV contrast (APPLIED)
Comparison: None.

CLINICAL DATA: Do not generalized abdominal pain.

EXAM:
CT ABDOMEN AND PELVIS WITH CONTRAST
TECHNIQUE: Multidetector CT imaging of the abdomen and pelvis was performed
using the standard protocol following bolus administration of
intravenous contrast.
CONTRAST:  89 cc Isovue 300 intravenously.

[Series 3: abd/ pelvis 5.0 i30f 2 · axial · 0.92mm/px · z∈[+857,+1247]mm · 13 of 88 slices shown, 15 images]
[im 5/88  soft-tissue]
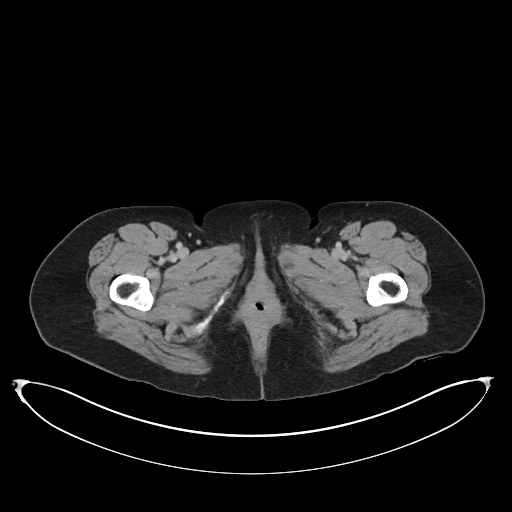
[im 5/88  bone]
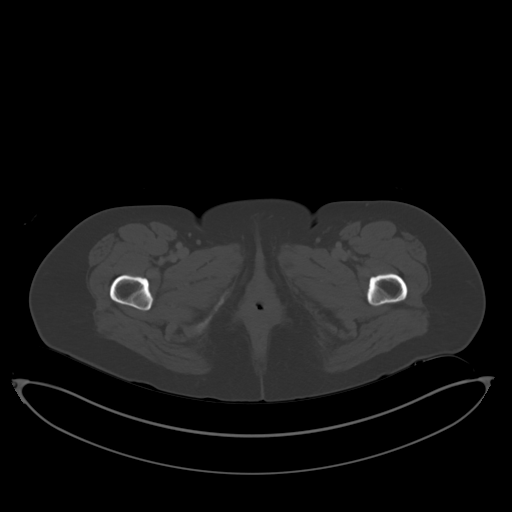
[im 13/88  soft-tissue]
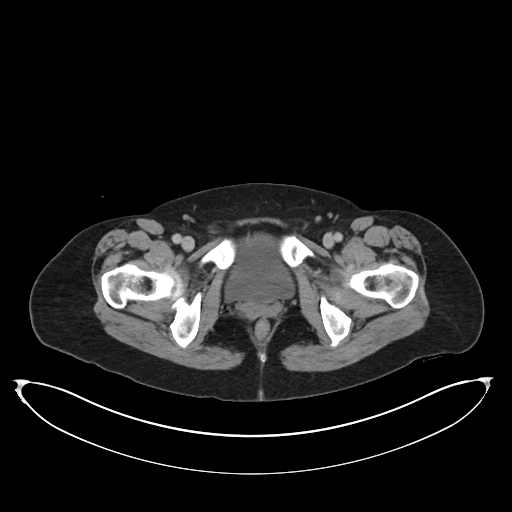
[im 17/88  soft-tissue]
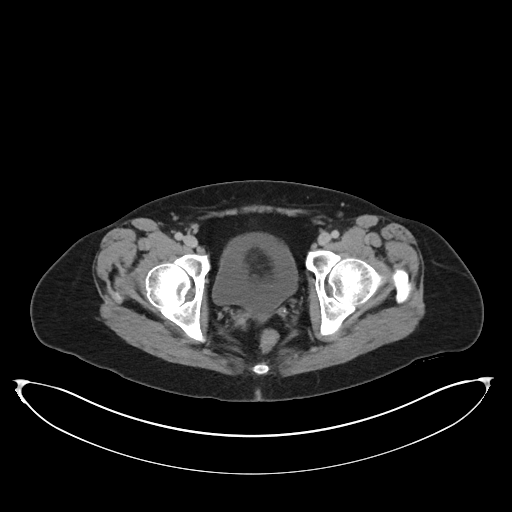
[im 25/88  soft-tissue]
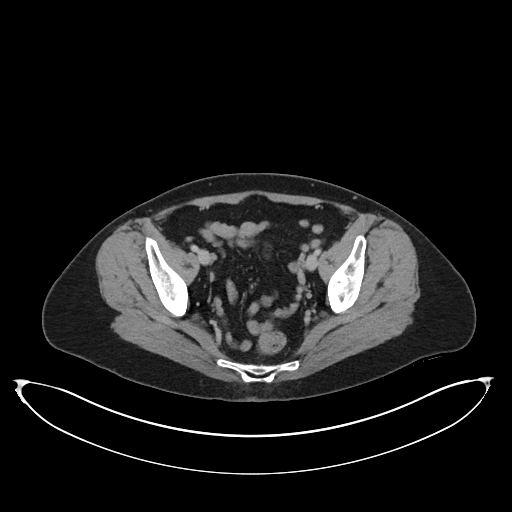
[im 30/88  soft-tissue]
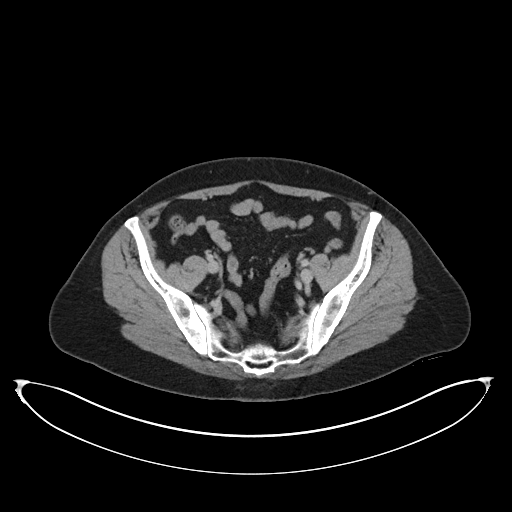
[im 38/88  soft-tissue]
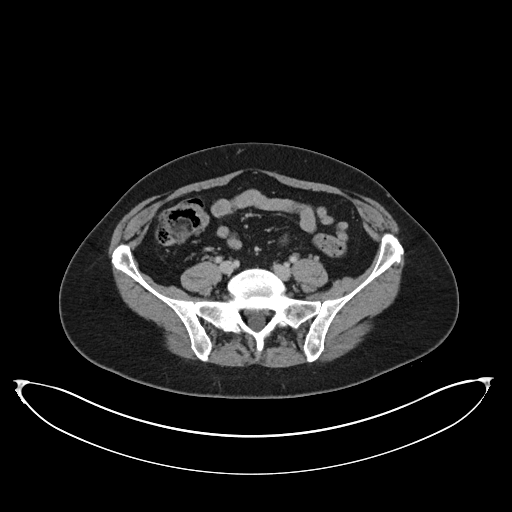
[im 46/88  soft-tissue]
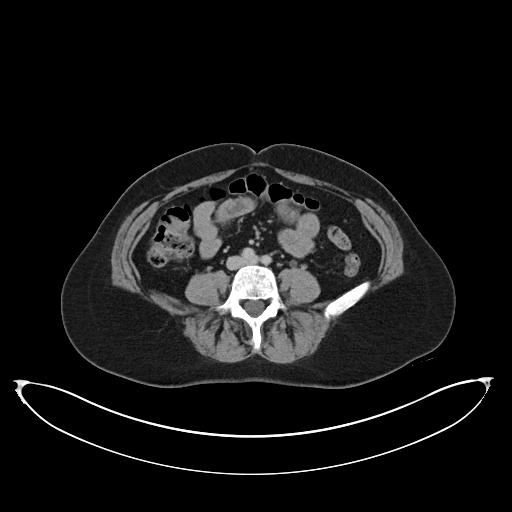
[im 50/88  soft-tissue]
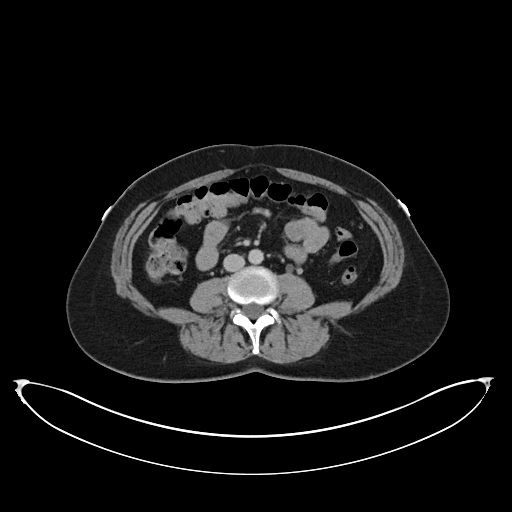
[im 59/88  soft-tissue]
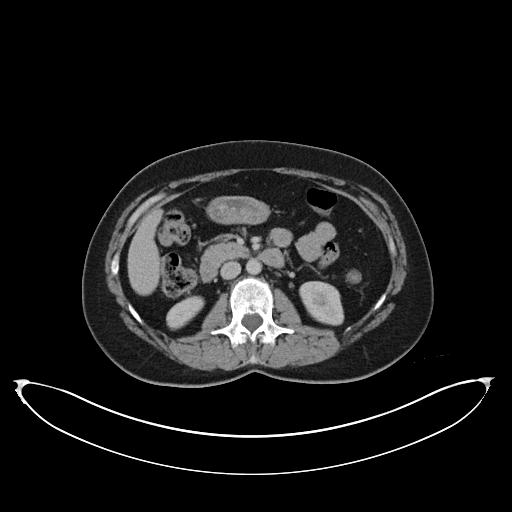
[im 59/88  bone]
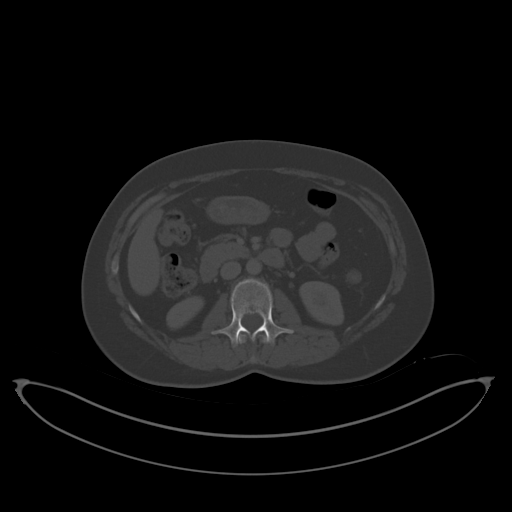
[im 63/88  soft-tissue]
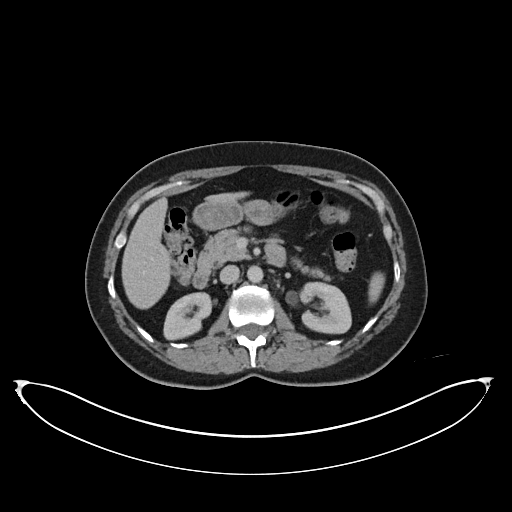
[im 71/88  soft-tissue]
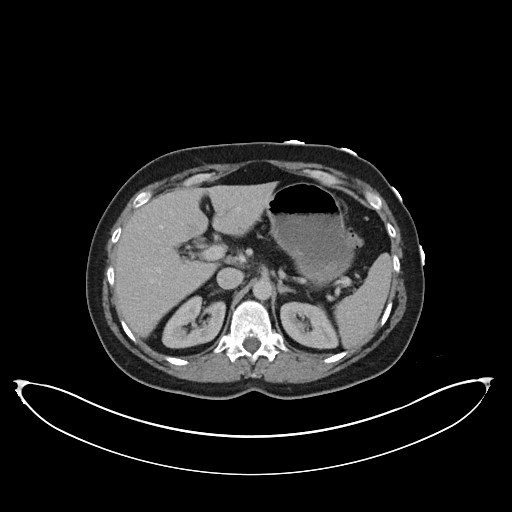
[im 75/88  soft-tissue]
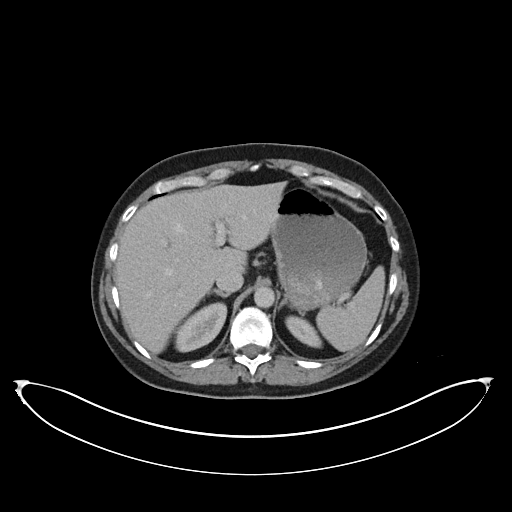
[im 83/88  soft-tissue]
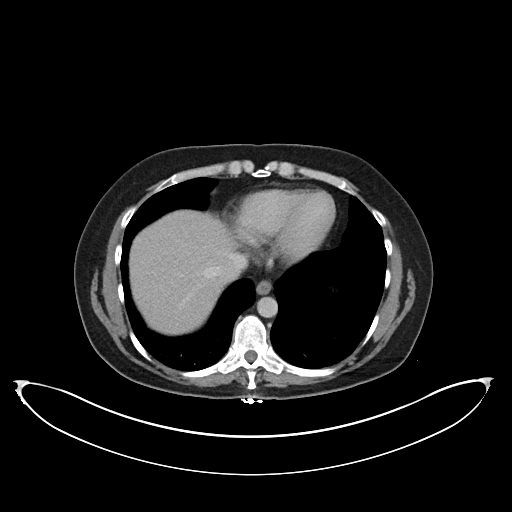

[Series 6: coronal soft tissue · coronal · 0.64mm/px · 3 of 99 slices shown]
[im 33/99  soft-tissue]
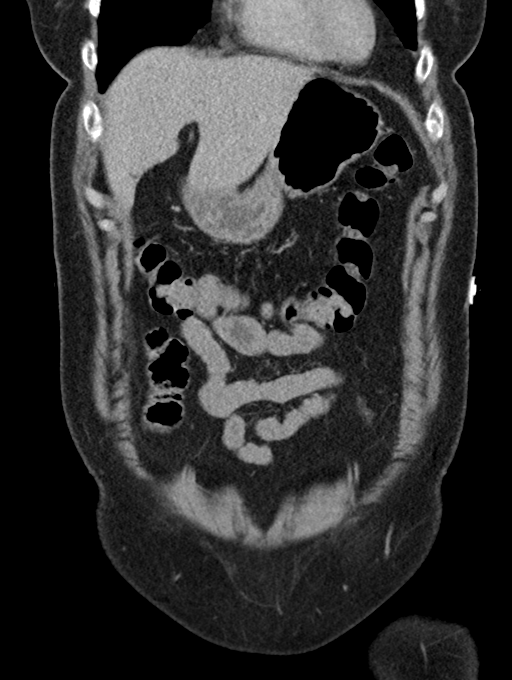
[im 44/99  soft-tissue]
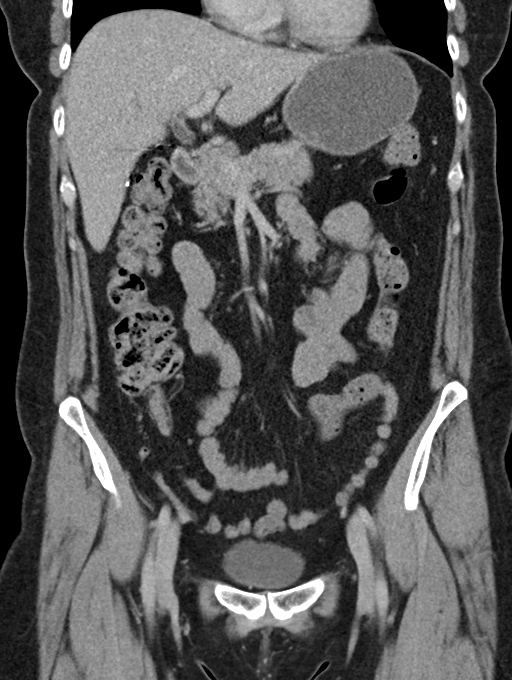
[im 55/99  soft-tissue]
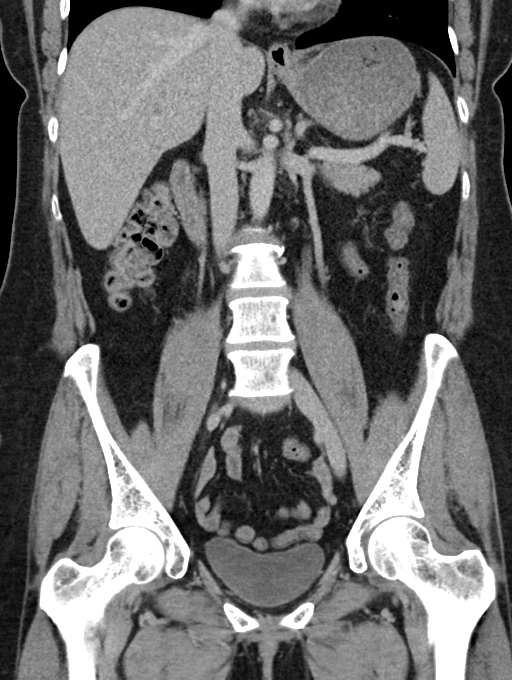

[16 of 46 positions shown; findings below may reference images not displayed]

FINDINGS: Lower chest: No acute abnormality.

Hepatobiliary: No focal liver abnormality is seen. Status post
cholecystectomy. No biliary dilatation.

Pancreas: Unremarkable. No pancreatic ductal dilatation or
surrounding inflammatory changes.

Spleen: Normal in size without focal abnormality.

Adrenals/Urinary Tract: Adrenal glands are unremarkable. Kidneys are
normal, without renal calculi, focal lesion, or hydronephrosis.
Bladder is unremarkable.

Stomach/Bowel: Stomach is within normal limits. Appendix appears
normal. No evidence of bowel wall thickening, distention, or
inflammatory changes.

Vascular/Lymphatic: No significant vascular findings are present. No
enlarged abdominal or pelvic lymph nodes.

Reproductive: Status post hysterectomy. No adnexal masses.

Other: No abdominal wall hernia or abnormality. No abdominopelvic
ascites.

Musculoskeletal: L5 pars articularis defects with mild posterior
facet arthropathy.
IMPRESSION: No acute or significant abnormality within the abdomen or pelvis.

L5 pars articularis defects with mild posterior facet arthropathy.

## 2019-01-09 ENCOUNTER — Encounter: Payer: BLUE CROSS/BLUE SHIELD | Admitting: Family Medicine

## 2019-02-10 ENCOUNTER — Encounter: Payer: Self-pay | Admitting: Family Medicine

## 2019-02-10 ENCOUNTER — Ambulatory Visit (INDEPENDENT_AMBULATORY_CARE_PROVIDER_SITE_OTHER): Payer: BLUE CROSS/BLUE SHIELD | Admitting: Family Medicine

## 2019-02-10 ENCOUNTER — Ambulatory Visit
Admission: RE | Admit: 2019-02-10 | Discharge: 2019-02-10 | Disposition: A | Discharge status: Home / Self Care | Payer: BLUE CROSS/BLUE SHIELD | Source: Ambulatory Visit | Attending: Family Medicine | Admitting: Family Medicine

## 2019-02-10 ENCOUNTER — Other Ambulatory Visit: Payer: Self-pay

## 2019-02-10 VITALS — BP 120/80 | HR 71 | Temp 98.6°F | Ht 63.5 in | Wt 137.8 lb

## 2019-02-10 DIAGNOSIS — R053 Chronic cough: Secondary | ICD-10-CM

## 2019-02-10 DIAGNOSIS — Z0000 Encounter for general adult medical examination without abnormal findings: Secondary | ICD-10-CM | POA: Diagnosis not present

## 2019-02-10 DIAGNOSIS — E7800 Pure hypercholesterolemia, unspecified: Secondary | ICD-10-CM | POA: Insufficient documentation

## 2019-02-10 DIAGNOSIS — M79632 Pain in left forearm: Secondary | ICD-10-CM | POA: Insufficient documentation

## 2019-02-10 DIAGNOSIS — H6121 Impacted cerumen, right ear: Secondary | ICD-10-CM | POA: Diagnosis not present

## 2019-02-10 DIAGNOSIS — Z87891 Personal history of nicotine dependence: Secondary | ICD-10-CM | POA: Insufficient documentation

## 2019-02-10 DIAGNOSIS — R7301 Impaired fasting glucose: Secondary | ICD-10-CM | POA: Diagnosis not present

## 2019-02-10 DIAGNOSIS — R05 Cough: Secondary | ICD-10-CM | POA: Diagnosis not present

## 2019-02-10 NOTE — Progress Notes (Signed)
Subjective:    Patient ID: ALAISHA Ho, female    DOB: 1961-05-31, 58 y.o.   MRN: [id number]  HPI Chief Complaint  Patient presents with   fasting cpe    fasting cpe, pain in left arm x9 months. ear is stopped up, dry cough x 9 months- used to smoke. gets eyes checked yearly   She is here for a complete physical exam and she has other concerns as well.   Other providers: Dr. Earlean Shawl- hemachromatosis  Dr. Tera Helper [facility]   Complains of chronic dry cough that is more bothersome during the day. Coughing 10-12 times per day. States her husband is concerned about her coughing.   Denies history of asthma, COPD, chronic bronchitis or pneumonia.  Former smoker with a 25 pack year hx and stopped 18 years ago.   Denies history of underlying allergies or GERD.   Denies fever, chills, fatigue, unexplained weight loss, chest pain, palpitations, shortness of breath, orthopnea, PND, lower extremity edema.  Complains of a 6-9 month history of intermittent left forearm pain. Denies injury.  Describes pain as an ache. Picking up her grandson makes the pain worse. Supination or with gripping makes the pain worse.  Takes ibuprofen at times and it helps.   Complains of decreased hearing to her right ear after using a Q-tip to get out wax.    Social history: Lives with husband and son, works as an Optometrist.  Diet: fairly healthy  Excerise: walks most days   Immunizations: Tdap last year. Shingrix UTD   Health maintenance:  Mammogram: January 2020  Colonoscopy: 2-3 years ago  Last Gynecological Exam: upcoming  Last Menstrual cycle: hysterectomy  Last Dental Exam: twice annually  Last Eye Exam: annually  Wears seatbelt always, uses sunscreen, smoke detectors in home and functioning, does not text while driving and feels safe in home environment.   Reviewed allergies, medications, past medical, surgical, family, and social history.    Review of Systems Review of  Systems Constitutional: -fever, -chills, -sweats, -unexpected weight change,-fatigue ENT: -runny nose, -ear pain, -sore throat Cardiology:  -chest pain, -palpitations, -edema Respiratory: +cough, -shortness of breath, -wheezing Gastroenterology: -abdominal pain, -nausea, -vomiting, -diarrhea, -constipation  Hematology: -bleeding or bruising problems Musculoskeletal: -arthralgias, +myalgias (left forearm), -joint swelling, -back pain Ophthalmology: -vision changes Urology: -dysuria, -difficulty urinating, -hematuria, -urinary frequency, -urgency Neurology: -headache, -weakness, -tingling, -numbness       Objective:   Physical Exam BP 120/80    Pulse 71    Temp 98.6 F (37 C) (Oral)    Ht 5' 3.5" (1.613 m)    Wt 137 lb 12.8 oz (62.5 kg)    BMI 24.03 kg/m   General Appearance:    Alert, cooperative, no distress, appears stated age  Head:    Normocephalic, without obvious abnormality, atraumatic  Eyes:    PERRL, conjunctiva/corneas clear, EOM's intact, fundi    benign  Ears:    Right ear with cerumen impaction. Normal TM's and external ear canals post ear lavage  Nose:   Nares normal, mucosa normal, no drainage or sinus   tenderness  Throat:   Lips, mucosa, and tongue normal; teeth and gums normal  Neck:   Supple, no lymphadenopathy;  thyroid:  no   enlargement/tenderness/nodules; no carotid   bruit or JVD  Back:    Spine nontender, no curvature, ROM normal, no CVA     tenderness  Lungs:     Clear to auscultation bilaterally without wheezes, rales or  ronchi; respirations unlabored  Chest Wall:    No tenderness or deformity   Heart:    Regular rate and rhythm, S1 and S2 normal, no murmur, rub   or gallop  Breast Exam:    OB/GYN  Abdomen:     Soft, non-tender, nondistended, normoactive bowel sounds,    no masses, no hepatosplenomegaly  Genitalia:    OB/GYN     Extremities:   No clubbing, cyanosis or edema. Left lateral epicondyle TTP, no atrophy, normal elbow, wrist ROM and equal  grip strength. Pain to left forearm with wrist extension and pronation  Pulses:   2+ and symmetric all extremities  Skin:   Skin color, texture, turgor normal, no rashes or lesions  Lymph nodes:   Cervical, supraclavicular, and axillary nodes normal  Neurologic:   CNII-XII intact, normal strength, sensation and gait; reflexes 2+ and symmetric throughout          Psych:   Normal mood, affect, hygiene and grooming.         Assessment & Plan:  Routine general medical examination at a health care facility - Plan: CBC with Differential/Platelet, Comprehensive metabolic panel, Lipid panel  Persistent dry cough - Plan: Spirometry with graph, DG Chest 2 View  Former smoker - Plan: DG Chest 2 View  Hearing loss of right ear due to cerumen impaction  Elevated LDL cholesterol level - Plan: Lipid panel  Left forearm pain Presents today for a fasting CPE and with other chronic complaints persistent dry cough for several months.  She is a former smoker Normal PFTs performed in office today I will send her for a chest XR Discussed allergies and GERD and possible contributors to her cough.  If her chest x-ray is normal which I expect then she may try an antacid such as Pepcid to see if this helps.  If not then she may also try nondrowsy antihistamine. Right ear cerumen impaction-ear lavage performed by CMA Sabrina.  Patient tolerated this well and hearing improved post lavage. Discussed that her left forearm pain appears to be related to lateral epicondylitis.  Offered referral to Ortho.  She is not yet tried conservative treatment so we will attempt this first.  Discussed anti-inflammatories, topical analgesic and potentially even using a splint to her left elbow.  She will let me know if this is not improving. Counseling on healthy diet and exercise. Immunizations up-to-date. She sees OB/GYN. Follow-up pending labs and chest x-ray.

## 2019-02-10 NOTE — Patient Instructions (Signed)
Try ibuprofen 600 mg three times per day with food for the next week to see if this helps your arm. You may also want to try a topical pain medication such as Salon Pas or Biofreeze.   If you are not improving or decide you would like to be evaluated further, let me know. We can refer you to an orthopedist as well.   For your cough: go to [place] for a chest XR.  Your pulmonary function test in our office today is normal.  Chronic dry cough may be caused by several things including allergies and/or acid reflux.  You could try taking Pepcid daily to see if acid is an issue.  You could also try an over the counter allergy medication such as Claritin, Allegra or Zytrec if you prefer to see if this helps.   Let me know if nothing is helping the cough or if it gets worse.      Tennis Elbow  Tennis elbow (lateral epicondylitis) is inflammation of tendons in your outer forearm, near your elbow. Tendons are tissues that connect muscle to bone. When you have tennis elbow, inflammation affects the tendons that you use to bend your wrist and move your hand up. Inflammation occurs in the lower part of the upper arm bone (humerus), where the tendons connect to the bone (lateral epicondyle). Tennis elbow often affects people who play tennis, but anyone may get the condition from repeatedly extending the wrist or turning the forearm. What are the causes? This condition is usually caused by repeatedly extending the wrist, turning the forearm, and using the hands. It can result from sports or work that requires repetitive forearm movements. In some cases, it may be caused by a sudden injury. What increases the risk? You are more likely to develop tennis elbow if you play tennis or another racket sport. You also have a higher risk if you frequently use your hands for work. Besides people who play tennis, others at greater risk include:  Musicians.  Carpenters, painters, and plumbers.  Cooks.   Cashiers.  People who work in [employer].  Architect workers.  Butchers.  People who use computers. What are the signs or symptoms? Symptoms of this condition include:  Pain and tenderness in the forearm and the outer part of the elbow. Pain may be felt only when using the arm, or it may be there all the time.  A burning feeling that starts in the elbow and spreads down the forearm.  A weak grip in the hand. How is this diagnosed? This condition may be diagnosed based on:  Your symptoms and medical history.  A physical exam.  X-rays.  MRI. How is this treated? Resting and icing your arm is often the first treatment. Your health care provider may also recommend:  Medicines to reduce pain and inflammation. These may be in the form of a pill, topical gels, or shots of a steroid medicine (cortisone).  An elbow strap to reduce stress on the area.  Physical therapy. This may include massage or exercises.  An elbow brace to restrict the movements that cause symptoms. If these treatments do not help relieve your symptoms, your health care provider may recommend surgery to remove damaged muscle and reattach healthy muscle to bone. Follow these instructions at home: Activity  Rest your elbow and wrist and avoid activities that cause symptoms, as told by your health care provider.  Do physical therapy exercises as instructed.  If you lift an object, lift  it with your palm facing up. This reduces stress on your elbow. Lifestyle  If your tennis elbow is caused by sports, check your equipment and make sure that: ? You are using it correctly. ? It is the best fit for you.  If your tennis elbow is caused by work or computer use, take frequent breaks to stretch your arm. Talk with your manager about ways to manage your condition at work. If you have a brace:  Wear the brace or strap as told by your health care provider. Remove it only as told by your health care provider.   Loosen the brace if your fingers tingle, become numb, or turn cold and blue.  Keep the brace clean.  If the brace is not waterproof, ask if you may remove it for bathing. If you must keep the brace on while bathing: ? Do not let it get wet. ? Cover it with a watertight covering when you take a bath or a shower. General instructions   If directed, put ice on the painful area: ? Put ice in a plastic bag. ? Place a towel between your skin and the bag. ? Leave the ice on for 20 minutes, 2-3 times a day.  Take over-the-counter and prescription medicines only as told by your health care provider.  Keep all follow-up visits as told by your health care provider. This is important. Contact a health care provider if:  You have pain that gets worse or does not get better with treatment.  You have numbness or weakness in your forearm, hand, or fingers. Summary  Tennis elbow (lateral epicondylitis) is inflammation of tendons in your outer forearm, near your elbow.  Common symptoms include pain and tenderness in your forearm and the outer part of your elbow.  This condition is usually caused by repeatedly extending your wrist, turning your forearm, and using your hands.  The first treatment is often resting and icing your arm to relieve symptoms. Further treatment may include taking medicine, getting physical therapy, wearing a brace or strap, or having surgery. This information is not intended to replace advice given to you by your health care provider. Make sure you discuss any questions you have with your health care provider. Document Released: 09/03/2005 Document Revised: 06/18/2017 Document Reviewed: 06/18/2017 Elsevier Interactive Patient Education  2019 [employer].

## 2019-02-11 LAB — CBC WITH DIFFERENTIAL/PLATELET
Basophils Absolute: 0 10*3/uL (ref 0.0–0.2)
Basos: 0 %
EOS (ABSOLUTE): 0.1 10*3/uL (ref 0.0–0.4)
Eos: 1 %
Hematocrit: 43.1 % (ref 34.0–46.6)
Hemoglobin: 15.1 g/dL (ref 11.1–15.9)
Immature Grans (Abs): 0 10*3/uL (ref 0.0–0.1)
Immature Granulocytes: 0 %
Lymphocytes Absolute: 2 10*3/uL (ref 0.7–3.1)
Lymphs: 29 %
MCH: 31.8 pg (ref 26.6–33.0)
MCHC: 35 g/dL (ref 31.5–35.7)
MCV: 91 fL (ref 79–97)
Monocytes Absolute: 0.5 10*3/uL (ref 0.1–0.9)
Monocytes: 7 %
Neutrophils Absolute: 4.2 10*3/uL (ref 1.4–7.0)
Neutrophils: 63 %
Platelets: 241 10*3/uL (ref 150–450)
RBC: 4.75 x10E6/uL (ref 3.77–5.28)
RDW: 11.8 % (ref 11.7–15.4)
WBC: 6.8 10*3/uL (ref 3.4–10.8)

## 2019-02-11 LAB — LIPID PANEL
Chol/HDL Ratio: 4.2 ratio (ref 0.0–4.4)
Cholesterol, Total: 186 mg/dL (ref 100–199)
HDL: 44 mg/dL (ref >39)
LDL Calculated: 119 mg/dL — ABNORMAL HIGH (ref 0–99)
Triglycerides: 116 mg/dL (ref 0–149)
VLDL Cholesterol Cal: 23 mg/dL (ref 5–40)

## 2019-02-11 LAB — COMPREHENSIVE METABOLIC PANEL
ALT: 14 IU/L (ref 0–32)
AST: 15 IU/L (ref 0–40)
Albumin/Globulin Ratio: 2 (ref 1.2–2.2)
Albumin: 4.5 g/dL (ref 3.8–4.9)
Alkaline Phosphatase: 79 IU/L (ref 39–117)
BUN/Creatinine Ratio: 14 (ref 9–23)
BUN: 12 mg/dL (ref 6–24)
Bilirubin Total: 0.4 mg/dL (ref 0.0–1.2)
CO2: 22 mmol/L (ref 20–29)
Calcium: 9.9 mg/dL (ref 8.7–10.2)
Chloride: 101 mmol/L (ref 96–106)
Creatinine, Ser: 0.85 mg/dL (ref 0.57–1.00)
GFR calc Af Amer: 87 mL/min/{1.73_m2} (ref >59)
GFR calc non Af Amer: 76 mL/min/{1.73_m2} (ref >59)
Globulin, Total: 2.3 g/dL (ref 1.5–4.5)
Glucose: 103 mg/dL — ABNORMAL HIGH (ref 65–99)
Potassium: 4.4 mmol/L (ref 3.5–5.2)
Sodium: 140 mmol/L (ref 134–144)
Total Protein: 6.8 g/dL (ref 6.0–8.5)

## 2019-02-26 DIAGNOSIS — H2513 Age-related nuclear cataract, bilateral: Secondary | ICD-10-CM | POA: Diagnosis not present

## 2019-02-26 DIAGNOSIS — H25013 Cortical age-related cataract, bilateral: Secondary | ICD-10-CM | POA: Diagnosis not present

## 2019-02-26 DIAGNOSIS — H52203 Unspecified astigmatism, bilateral: Secondary | ICD-10-CM | POA: Diagnosis not present

## 2019-02-26 DIAGNOSIS — H5213 Myopia, bilateral: Secondary | ICD-10-CM | POA: Diagnosis not present

## 2019-02-26 LAB — HGB A1C W/O EAG: Hgb A1c MFr Bld: 5.2 % (ref 4.8–5.6)

## 2019-02-26 LAB — SPECIMEN STATUS REPORT

## 2019-05-28 ENCOUNTER — Encounter: Payer: Self-pay | Admitting: Family Medicine

## 2019-07-27 DIAGNOSIS — D220 Melanocytic nevi of lip: Secondary | ICD-10-CM | POA: Diagnosis not present

## 2019-07-27 DIAGNOSIS — L821 Other seborrheic keratosis: Secondary | ICD-10-CM | POA: Diagnosis not present

## 2019-07-27 DIAGNOSIS — L570 Actinic keratosis: Secondary | ICD-10-CM | POA: Diagnosis not present

## 2019-07-27 DIAGNOSIS — L814 Other melanin hyperpigmentation: Secondary | ICD-10-CM | POA: Diagnosis not present

## 2019-07-27 DIAGNOSIS — Z85828 Personal history of other malignant neoplasm of skin: Secondary | ICD-10-CM | POA: Diagnosis not present

## 2019-08-11 ENCOUNTER — Other Ambulatory Visit (INDEPENDENT_AMBULATORY_CARE_PROVIDER_SITE_OTHER): Payer: BC Managed Care – PPO

## 2019-08-11 ENCOUNTER — Other Ambulatory Visit: Payer: Self-pay

## 2019-08-11 DIAGNOSIS — Z23 Encounter for immunization: Secondary | ICD-10-CM

## 2019-08-31 ENCOUNTER — Other Ambulatory Visit: Payer: Self-pay | Admitting: Gynecology

## 2019-08-31 DIAGNOSIS — Z1231 Encounter for screening mammogram for malignant neoplasm of breast: Secondary | ICD-10-CM

## 2019-10-19 ENCOUNTER — Other Ambulatory Visit: Payer: Self-pay

## 2019-10-19 ENCOUNTER — Ambulatory Visit
Admission: RE | Admit: 2019-10-19 | Discharge: 2019-10-19 | Disposition: A | Discharge status: Home / Self Care | Payer: BLUE CROSS/BLUE SHIELD | Source: Ambulatory Visit | Attending: Gynecology | Admitting: Gynecology

## 2019-10-19 DIAGNOSIS — Z1231 Encounter for screening mammogram for malignant neoplasm of breast: Secondary | ICD-10-CM

## 2019-11-07 DIAGNOSIS — Z20828 Contact with and (suspected) exposure to other viral communicable diseases: Secondary | ICD-10-CM | POA: Diagnosis not present

## 2019-11-08 DIAGNOSIS — Z2089 Contact with and (suspected) exposure to other communicable diseases: Secondary | ICD-10-CM | POA: Diagnosis not present

## 2019-11-08 DIAGNOSIS — Z20822 Contact with and (suspected) exposure to covid-19: Secondary | ICD-10-CM | POA: Diagnosis not present

## 2020-03-01 DIAGNOSIS — H5213 Myopia, bilateral: Secondary | ICD-10-CM | POA: Diagnosis not present

## 2020-03-01 DIAGNOSIS — H25013 Cortical age-related cataract, bilateral: Secondary | ICD-10-CM | POA: Diagnosis not present

## 2020-06-03 DIAGNOSIS — Z20822 Contact with and (suspected) exposure to covid-19: Secondary | ICD-10-CM | POA: Diagnosis not present

## 2020-06-10 ENCOUNTER — Ambulatory Visit: Payer: BC Managed Care – PPO | Attending: Internal Medicine

## 2020-06-10 ENCOUNTER — Other Ambulatory Visit: Payer: Self-pay

## 2020-06-10 DIAGNOSIS — Z23 Encounter for immunization: Secondary | ICD-10-CM

## 2020-06-10 NOTE — Progress Notes (Signed)
   Covid-19 Vaccination Clinic  Name:  Sabrina Ho    MRN: [id number] DOB: [birth year]  06/10/2020  [name] was observed post Covid-19 immunization for 15 minutes without incident. She was provided with Vaccine Information Sheet and instruction to access the V-Safe system. Vaccinated by [facility].  [name] was instructed to call 911 with any severe reactions post vaccine: Marland Kitchen Difficulty breathing  . Swelling of face and throat  . A fast heartbeat  . A bad rash all over body  . Dizziness and weakness   Immunizations Administered    Name Date Dose VIS Date Route   JANSSEN COVID-19 VACCINE 06/10/2020 10:18 AM 0.5 mL 11/14/2019 Intramuscular   Manufacturer: [employer]ph[employer]   Lot: [id number]   Gray: [phone]

## 2020-07-21 ENCOUNTER — Ambulatory Visit: Payer: BC Managed Care – PPO | Admitting: Family Medicine

## 2020-07-21 ENCOUNTER — Other Ambulatory Visit: Payer: Self-pay

## 2020-07-21 ENCOUNTER — Encounter: Payer: Self-pay | Admitting: Family Medicine

## 2020-07-21 ENCOUNTER — Ambulatory Visit
Admission: RE | Admit: 2020-07-21 | Discharge: 2020-07-21 | Disposition: A | Discharge status: Home / Self Care | Payer: BC Managed Care – PPO | Source: Ambulatory Visit | Attending: Family Medicine | Admitting: Family Medicine

## 2020-07-21 VITALS — BP 120/80 | HR 103 | Ht 64.0 in | Wt 139.8 lb

## 2020-07-21 DIAGNOSIS — Z1329 Encounter for screening for other suspected endocrine disorder: Secondary | ICD-10-CM | POA: Diagnosis not present

## 2020-07-21 DIAGNOSIS — R053 Chronic cough: Secondary | ICD-10-CM

## 2020-07-21 DIAGNOSIS — Z23 Encounter for immunization: Secondary | ICD-10-CM | POA: Diagnosis not present

## 2020-07-21 DIAGNOSIS — Z0000 Encounter for general adult medical examination without abnormal findings: Secondary | ICD-10-CM

## 2020-07-21 DIAGNOSIS — R059 Cough, unspecified: Secondary | ICD-10-CM | POA: Diagnosis not present

## 2020-07-21 DIAGNOSIS — E7800 Pure hypercholesterolemia, unspecified: Secondary | ICD-10-CM

## 2020-07-21 MED ORDER — ESOMEPRAZOLE MAGNESIUM 40 MG PO CPDR: 40.0000 mg | DELAYED_RELEASE_CAPSULE | Freq: Every day | ORAL | 0 refills | Status: DC

## 2020-07-21 NOTE — Progress Notes (Signed)
Subjective:    Patient ID: Sabrina Ho, female    DOB: [birth year], 59 y.o.   MRN: [id number]  HPI Chief Complaint  Patient presents with  . nonfasting cpe    nonfasting cpe. has obgyn but hasn't been since covid   She is here for a complete physical exam. Last CPE: 01/2019  Other providers: OB/GYN- Dr. Gita Kudo  GI- Dr. Earlean Shawl  Dermatologist for annual exams  Complains of persistent dry cough for more than a year.  Cough is mainly during the day and does not wake her up.  We discussed this last year at her physical and I recommended she try allergy medication as well as reflux medication.  She also had a negative chest x-ray at that time. states she only took Claritin for about a week.  States she took omeprazole for 2 weeks.  She has not tried these medications since. States over-the-counter cough medication has not helped.   Former smoker and stopped 19 years ago. 25 pack year history  Denies history of asthma, COPD Denies fever, chills, fatigue, unexplained weight loss, hemoptysis, chest pain, palpitations, shortness of breath, abdominal pain, nausea, vomiting. Denies DOE and she has been walking more recently.   Social history: Lives with husband, she has 2 kids, works as Optometrist   Denies drinking alcohol, drug use  Diet: fairly healthy. Husband now has diabetes  Excerise: walking daily   Health maintenance:  Mammogram: 10/2021 Colonoscopy: 09/2014 and overdue  Last Gynecological Exam: 2019  Last Dental Exam: every 6 months  Last Eye Exam: 3 months ago   Wears seatbelt always, uses sunscreen, smoke detectors in home and functioning, does not text while driving and feels safe in home environment.   Reviewed allergies, medications, past medical, surgical, family, and social history.    Review of Systems Review of Systems Constitutional: -fever, -chills, -sweats, -unexpected weight change,-fatigue ENT: -runny nose, -ear pain, -sore throat Cardiology:  -chest  pain, -palpitations, -edema Respiratory: +cough, -shortness of breath, -wheezing Gastroenterology: -abdominal pain, -nausea, -vomiting, -diarrhea, -constipation  Hematology: -bleeding or bruising problems Musculoskeletal: -arthralgias, -myalgias, -joint swelling, -back pain Ophthalmology: -vision changes Urology: -dysuria, -difficulty urinating, -hematuria, -urinary frequency, -urgency Neurology: -headache, -weakness, -tingling, -numbness       Objective:   Physical Exam BP 120/80   Pulse (!) 103   Ht 5\' 4"  (1.626 m)   Wt 139 lb 12.8 oz (63.4 kg)   BMI 24.00 kg/m   General Appearance:    Alert, cooperative, no distress, appears stated age  Head:    Normocephalic, without obvious abnormality, atraumatic  Eyes:    PERRL, conjunctiva/corneas clear, EOM's intact  Ears:    Normal TM's and external ear canals  Nose:  Mask on  Throat:   Lips, mucosa, and tongue normal; teeth and gums normal  Neck:   Supple, no lymphadenopathy;  thyroid:  no   enlargement/tenderness/nodules; no JVD  Back:    Spine nontender, no curvature, ROM normal, no CVA     tenderness  Lungs:     Clear to auscultation bilaterally without wheezes, rales or     ronchi; respirations unlabored  Chest Wall:    No tenderness or deformity   Heart:    Regular rate and rhythm, S1 and S2 normal, no murmur, rub   or gallop  Breast Exam:   OB/GYN  Abdomen:     Soft, non-tender, nondistended, normoactive bowel sounds,    no masses, no hepatosplenomegaly  Genitalia:   OB/GYN  Extremities:   No clubbing, cyanosis or edema  Pulses:   2+ and symmetric all extremities  Skin:   Skin color, texture, turgor normal, no rashes or lesions  Lymph nodes:   Cervical, supraclavicular, and axillary nodes normal  Neurologic:   CNII-XII intact, normal strength, sensation and gait; reflexes 2+ and symmetric throughout          Psych:   Normal mood, affect, hygiene and grooming.         Assessment & Plan:  Routine general medical  examination at a health care facility - Plan: CBC with Differential/Platelet, Comprehensive metabolic panel, TSH, T4, free, Lipid panel -She is here for a CPE however she is not fasting.  She will return for labs and the orders are in the computer. Preventive health care reviewed.  She will soon follow-up with her OB/GYN and gastroenterologist.  She also has upcoming dermatology appointment.  Counseling on healthy lifestyle including diet and exercise and she does appear to be taking good care of her self.  Immunizations reviewed.  Chronic cough - Plan: DG Chest 2 View, esomeprazole (NEXIUM) 40 MG capsule -Her cough has been ongoing for approximately 1-1/2 years.  I will send her for a follow-up x-ray.  Discussed possible etiologies including allergies, GERD or reactive airway disease.  I will start her on Nexium and encouraged her to take this for the next 2 to 4 weeks.  I also recommend she take over-the-counter Xyzal for the next 2 to 4 weeks.  Offered to refer to pulmonology for PFTs and further evaluation and she prefers to hold off and see if she gets any improvement with allergy and GERD medications.  I'll follow-up pending x-ray results and in 4 weeks.  Elevated LDL cholesterol level - Plan: Lipid panel -Follow-up pending results  Screening for thyroid disorder - Plan: TSH, T4, free Follow-up pending results

## 2020-07-21 NOTE — Patient Instructions (Signed)
Call and schedule with your OB/GYN  Call and schedule with your gastroenterologist  Go to [facility] imaging and get your chest x-ray  Try the prescription Nexium and over-the-counter Xyzal and see if this helps your cough. After 1 month if you do not notice significant improvement or resolution then please follow-up with me    Preventive Care 59-59 Years Old, Female Preventive care refers to visits with your health care provider and lifestyle choices that can promote health and wellness. This includes:  A yearly physical exam. This may also be called an annual well check.  Regular dental visits and eye exams.  Immunizations.  Screening for certain conditions.  Healthy lifestyle choices, such as eating a healthy diet, getting regular exercise, not using drugs or products that contain nicotine and tobacco, and limiting alcohol use. What can I expect for my preventive care visit? Physical exam Your health care provider will check your:  Height and weight. This may be used to calculate body mass index (BMI), which tells if you are at a healthy weight.  Heart rate and blood pressure.  Skin for abnormal spots. Counseling Your health care provider may ask you questions about your:  Alcohol, tobacco, and drug use.  Emotional well-being.  Home and relationship well-being.  Sexual activity.  Eating habits.  Work and work Statistician.  Method of birth control.  Menstrual cycle.  Pregnancy history. What immunizations do I need?  Influenza (flu) vaccine  This is recommended every year. Tetanus, diphtheria, and pertussis (Tdap) vaccine  You may need a Td booster every 10 years. Varicella (chickenpox) vaccine  You may need this if you have not been vaccinated. Zoster (shingles) vaccine  You may need this after age 59. Measles, mumps, and rubella (MMR) vaccine  You may need at least one dose of MMR if you were born in 1957 or later. You may also need a second  dose. Pneumococcal conjugate (PCV13) vaccine  You may need this if you have certain conditions and were not previously vaccinated. Pneumococcal polysaccharide (PPSV23) vaccine  You may need one or two doses if you smoke cigarettes or if you have certain conditions. Meningococcal conjugate (MenACWY) vaccine  You may need this if you have certain conditions. Hepatitis A vaccine  You may need this if you have certain conditions or if you travel or work in places where you may be exposed to hepatitis A. Hepatitis B vaccine  You may need this if you have certain conditions or if you travel or work in places where you may be exposed to hepatitis B. Haemophilus influenzae type b (Hib) vaccine  You may need this if you have certain conditions. Human papillomavirus (HPV) vaccine  If recommended by your health care provider, you may need three doses over 6 months. You may receive vaccines as individual doses or as more than one vaccine together in one shot (combination vaccines). Talk with your health care provider about the risks and benefits of combination vaccines. What tests do I need? Blood tests  Lipid and cholesterol levels. These may be checked every 5 years, or more frequently if you are over 59 years old.  Hepatitis C test.  Hepatitis B test. Screening  Lung cancer screening. You may have this screening every year starting at age 59 if you have a 30-pack-year history of smoking and currently smoke or have quit within the past 15 years.  Colorectal cancer screening. All adults should have this screening starting at age 59 and continuing until age 59.  Your health care provider may recommend screening at age 59 if you are at increased risk. You will have tests every 1-10 years, depending on your results and the type of screening test.  Diabetes screening. This is done by checking your blood sugar (glucose) after you have not eaten for a while (fasting). You may have this done every  1-3 years.  Mammogram. This may be done every 1-2 years. Talk with your health care provider about when you should start having regular mammograms. This may depend on whether you have a family history of breast cancer.  BRCA-related cancer screening. This may be done if you have a family history of breast, ovarian, tubal, or peritoneal cancers.  Pelvic exam and Pap test. This may be done every 3 years starting at age 59. Starting at age 59, this may be done every 5 years if you have a Pap test in combination with an HPV test. Other tests  Sexually transmitted disease (STD) testing.  Bone density scan. This is done to screen for osteoporosis. You may have this scan if you are at high risk for osteoporosis. Follow these instructions at home: Eating and drinking  Eat a diet that includes fresh fruits and vegetables, whole grains, lean protein, and low-fat dairy.  Take vitamin and mineral supplements as recommended by your health care provider.  Do not drink alcohol if: ? Your health care provider tells you not to drink. ? You are pregnant, may be pregnant, or are planning to become pregnant.  If you drink alcohol: ? Limit how much you have to 0-1 drink a day. ? Be aware of how much alcohol is in your drink. In the [place] one drink equals one 12 oz bottle of beer (355 mL), one 5 oz glass of wine (148 mL), or one 1 oz glass of hard liquor (44 mL). Lifestyle  Take daily care of your teeth and gums.  Stay active. Exercise for at least 30 minutes on 5 or more days each week.  Do not use any products that contain nicotine or tobacco, such as cigarettes, e-cigarettes, and chewing tobacco. If you need help quitting, ask your health care provider.  If you are sexually active, practice safe sex. Use a condom or other form of birth control (contraception) in order to prevent pregnancy and STIs (sexually transmitted infections).  If told by your health care provider, take low-dose aspirin daily  starting at age 59. What's next?  Visit your health care provider once a year for a well check visit.  Ask your health care provider how often you should have your eyes and teeth checked.  Stay up to date on all vaccines. This information is not intended to replace advice given to you by your health care provider. Make sure you discuss any questions you have with your health care provider. Document Revised: 05/15/2018 Document Reviewed: 05/15/2018 Elsevier Patient Education  2020 [employer].

## 2020-07-22 ENCOUNTER — Other Ambulatory Visit: Payer: BC Managed Care – PPO

## 2020-07-22 DIAGNOSIS — E7800 Pure hypercholesterolemia, unspecified: Secondary | ICD-10-CM | POA: Diagnosis not present

## 2020-07-22 DIAGNOSIS — Z1329 Encounter for screening for other suspected endocrine disorder: Secondary | ICD-10-CM | POA: Diagnosis not present

## 2020-07-22 DIAGNOSIS — Z0000 Encounter for general adult medical examination without abnormal findings: Secondary | ICD-10-CM | POA: Diagnosis not present

## 2020-07-22 NOTE — Addendum Note (Signed)
Addended by: Minette Headland A on: 07/22/2020 07:43 AM   Modules accepted: Orders

## 2020-07-23 LAB — LIPID PANEL
Chol/HDL Ratio: 4.4 ratio (ref 0.0–4.4)
Cholesterol, Total: 185 mg/dL (ref 100–199)
HDL: 42 mg/dL (ref >39)
LDL Chol Calc (NIH): 122 mg/dL — ABNORMAL HIGH (ref 0–99)
Triglycerides: 117 mg/dL (ref 0–149)
VLDL Cholesterol Cal: 21 mg/dL (ref 5–40)

## 2020-07-23 LAB — CBC WITH DIFFERENTIAL/PLATELET
Basophils Absolute: 0 10*3/uL (ref 0.0–0.2)
Basos: 1 %
EOS (ABSOLUTE): 0.1 10*3/uL (ref 0.0–0.4)
Eos: 1 %
Hematocrit: 41.6 % (ref 34.0–46.6)
Hemoglobin: 14.4 g/dL (ref 11.1–15.9)
Immature Grans (Abs): 0 10*3/uL (ref 0.0–0.1)
Immature Granulocytes: 0 %
Lymphocytes Absolute: 1.9 10*3/uL (ref 0.7–3.1)
Lymphs: 29 %
MCH: 32.7 pg (ref 26.6–33.0)
MCHC: 34.6 g/dL (ref 31.5–35.7)
MCV: 95 fL (ref 79–97)
Monocytes Absolute: 0.6 10*3/uL (ref 0.1–0.9)
Monocytes: 10 %
Neutrophils Absolute: 3.9 10*3/uL (ref 1.4–7.0)
Neutrophils: 59 %
Platelets: 252 10*3/uL (ref 150–450)
RBC: 4.4 x10E6/uL (ref 3.77–5.28)
RDW: 11.9 % (ref 11.7–15.4)
WBC: 6.5 10*3/uL (ref 3.4–10.8)

## 2020-07-23 LAB — COMPREHENSIVE METABOLIC PANEL
ALT: 27 IU/L (ref 0–32)
AST: 18 IU/L (ref 0–40)
Albumin/Globulin Ratio: 1.9 (ref 1.2–2.2)
Albumin: 4.1 g/dL (ref 3.8–4.9)
Alkaline Phosphatase: 87 IU/L (ref 44–121)
BUN/Creatinine Ratio: 10 (ref 9–23)
BUN: 9 mg/dL (ref 6–24)
Bilirubin Total: 0.5 mg/dL (ref 0.0–1.2)
CO2: 25 mmol/L (ref 20–29)
Calcium: 9.5 mg/dL (ref 8.7–10.2)
Chloride: 102 mmol/L (ref 96–106)
Creatinine, Ser: 0.89 mg/dL (ref 0.57–1.00)
GFR calc Af Amer: 82 mL/min/{1.73_m2} (ref >59)
GFR calc non Af Amer: 71 mL/min/{1.73_m2} (ref >59)
Globulin, Total: 2.2 g/dL (ref 1.5–4.5)
Glucose: 89 mg/dL (ref 65–99)
Potassium: 4.7 mmol/L (ref 3.5–5.2)
Sodium: 140 mmol/L (ref 134–144)
Total Protein: 6.3 g/dL (ref 6.0–8.5)

## 2020-07-23 LAB — T4, FREE: Free T4: 1.33 ng/dL (ref 0.82–1.77)

## 2020-07-23 LAB — TSH: TSH: 2.35 u[IU]/mL (ref 0.450–4.500)

## 2020-07-27 DIAGNOSIS — D2271 Melanocytic nevi of right lower limb, including hip: Secondary | ICD-10-CM | POA: Diagnosis not present

## 2020-07-27 DIAGNOSIS — D225 Melanocytic nevi of trunk: Secondary | ICD-10-CM | POA: Diagnosis not present

## 2020-07-27 DIAGNOSIS — Z85828 Personal history of other malignant neoplasm of skin: Secondary | ICD-10-CM | POA: Diagnosis not present

## 2020-07-27 DIAGNOSIS — D2239 Melanocytic nevi of other parts of face: Secondary | ICD-10-CM | POA: Diagnosis not present

## 2020-08-10 DIAGNOSIS — Z20822 Contact with and (suspected) exposure to covid-19: Secondary | ICD-10-CM | POA: Diagnosis not present

## 2020-08-22 ENCOUNTER — Ambulatory Visit: Payer: BC Managed Care – PPO | Admitting: Family Medicine

## 2020-08-24 ENCOUNTER — Encounter: Payer: Self-pay | Admitting: Family Medicine

## 2020-08-24 ENCOUNTER — Other Ambulatory Visit: Payer: Self-pay

## 2020-08-24 ENCOUNTER — Ambulatory Visit: Payer: BC Managed Care – PPO | Admitting: Family Medicine

## 2020-08-24 VITALS — BP 120/70 | HR 66 | Temp 98.1°F | Resp 16 | Wt 141.4 lb

## 2020-08-24 DIAGNOSIS — R053 Chronic cough: Secondary | ICD-10-CM | POA: Diagnosis not present

## 2020-08-24 DIAGNOSIS — Z87891 Personal history of nicotine dependence: Secondary | ICD-10-CM

## 2020-08-24 NOTE — Progress Notes (Addendum)
   Subjective:    Patient ID: Sabrina Ho, female    DOB: June 15, 1961, 59 y.o.   MRN: [id number]  HPI Chief Complaint  Patient presents with  . follow-up    follow-up on cough, got better but never went away and now getting worse again   She is here for a 4-week follow-up on chronic cough.  She has had a persistent dry cough for almost 2 years. States she took Nexium 40 mg and Xyzal for the past 4 weeks and her cough improved may be 50%.  States her cough is getting worse again.  It is still a dry cough.  Mainly only an issue during the day and does not wake her up at night. She occasionally feels "tightness" in her chest.  I sent her for a chest XR and it was negative.   States she stopped smoking 20 years. 25 pack year history.  No hx of asthma, COPD.   Denies fever, chills, fatigue, weight loss, dizziness, chest pain, palpitations, orthopnea, shortness of breath, wheezing, hemoptysis, abdominal pain, N/V/D, urinary symptoms, LE edema.  No DOE.   Reviewed allergies, medications, past medical, surgical, family, and social history.    Review of Systems Pertinent positives and negatives in the history of present illness.     Objective:   Physical Exam BP 120/70   Pulse 66   Temp 98.1 F (36.7 C)   Resp 16   Wt 141 lb 6.4 oz (64.1 kg)   SpO2 99%   BMI 24.27 kg/m   Alert and in no distress.  Cardiac exam shows a regular sinus rhythm without murmurs or gallops. Lungs are clear to auscultation.  Respirations unlabored.  Extremities without edema.  Skin is warm and dry.      Assessment & Plan:  Chronic cough - Plan: Ambulatory referral to Pulmonology  Former smoker - Plan: Ambulatory referral to Pulmonology  Chronic dry cough for approximately 2 years.  We have tried Xyzal and Nexium and she has not improved significantly.  Chest x-ray negative.  Former smoker but did stop approximate 20 years ago. I will refer her to pulmonology for further evaluation.

## 2020-09-15 ENCOUNTER — Other Ambulatory Visit: Payer: Self-pay | Admitting: Gynecology

## 2020-09-15 DIAGNOSIS — Z1231 Encounter for screening mammogram for malignant neoplasm of breast: Secondary | ICD-10-CM

## 2020-09-27 ENCOUNTER — Other Ambulatory Visit: Payer: Self-pay

## 2020-09-27 ENCOUNTER — Ambulatory Visit (INDEPENDENT_AMBULATORY_CARE_PROVIDER_SITE_OTHER): Payer: BC Managed Care – PPO | Admitting: Pulmonary Disease

## 2020-09-27 ENCOUNTER — Encounter: Payer: Self-pay | Admitting: Pulmonary Disease

## 2020-09-27 VITALS — BP 122/74 | HR 71 | Temp 97.3°F | Ht 64.0 in | Wt 139.3 lb

## 2020-09-27 DIAGNOSIS — R053 Chronic cough: Secondary | ICD-10-CM

## 2020-09-27 NOTE — Progress Notes (Signed)
Synopsis: Referred in 09/2020 for Cough  Subjective:   PATIENT ID: Sabrina Ho GENDER: female DOB: 01-27-1961, MRN: [id number]   HPI  Chief Complaint  Patient presents with  . Consult    Referred by PCP for chronic cough for the past year. Described as a non-productive cough. Denies any SOB or chest pain associated with cough.    Sabrina Ho is a a 60 year old woman, former smoker who is referred to pulmonary clinic for chronic cough.  She reports having dry cough for the past 2 years. She was started on xyzal and esomeprazole at an 07/21/20 visit with her PCP with not much improvement by her follow up visit on 08/24/20 which prompted referral to pulmonary clinic. Since last seen by her PCP, she reports her cough is about 90% better.   She denies dyspnea or wheezing with the cough. She denies any triggers or time of day that the cough may be worse. The cough does not wake her up at night. She denies sinus congestion or post-nasal drainage. She denies seasonal allergies. She denies overt GERD symptoms.   Chest radiograph 07/21/20 is unremarkable. There may be increased retrosternal airspace suspicious for obstructive lung disease.   She has about a 25 pack year history and quit 20 years ago. She has a maternal uncle with emphysema, otherwise no other lung conditions in the family. She denies growing up with second hand smoke exposure. She works as an Optometrist and does not have history of occupational exposures to dusts or chemicals.   Past Medical History:  Diagnosis Date  . Hemochromatosis      Family History  Problem Relation Age of Onset  . Hypertension Mother   . Gallbladder disease Mother        open cholecystectomy in mid-life.    . Thyroid disease Mother   . Prostate cancer Father   . Hypertension Father   . Cancer Sister        anal   . Heart defect Sister   . Breast cancer Niece 42     Social History   Socioeconomic History  . Marital status: Married     Spouse name: Not on file  . Number of children: Not on file  . Years of education: Not on file  . Highest education level: Not on file  Occupational History  . Not on file  Tobacco Use  . Smoking status: Former Smoker    Packs/day: 1.00    Years: 25.00    Pack years: 25.00    Quit date: 09/17/2000    Years since quitting: 20.0  . Smokeless tobacco: Former Systems developer    Quit date: 05/18/2001  Vaping Use  . Vaping Use: Never used  Substance and Sexual Activity  . Alcohol use: No  . Drug use: No  . Sexual activity: Yes  Other Topics Concern  . Not on file  Social History Narrative  . Not on file   Social Determinants of Health   Financial Resource Ho: Not on file  Food Insecurity: Not on file  Transportation Needs: Not on file  Physical Activity: Not on file  Stress: Not on file  Social Connections: Not on file  Intimate Partner Violence: Not on file     Allergies  Allergen Reactions  . Vancomycin Itching    Itching of scalp  . Amoxicillin Rash    Has patient had a PCN reaction causing immediate rash, facial/tongue/throat swelling, SOB or lightheadedness with hypotension: Yes Has patient  had a PCN reaction causing severe rash involving mucus membranes or skin necrosis: No Has patient had a PCN reaction that required hospitalization: No Has patient had a PCN reaction occurring within the last 10 years: No If all of the above answers are "NO", then may proceed with Cephalosporin use.      Outpatient Medications Prior to Visit  Medication Sig Dispense Refill  . esomeprazole (NEXIUM) 40 MG capsule Take 1 capsule (40 mg total) by mouth daily. 30 capsule 0   No facility-administered medications prior to visit.    Review of Systems  Constitutional: Negative for chills, diaphoresis, fever, malaise/fatigue and weight loss.  HENT: Negative for congestion, sinus pain and sore throat.   Eyes: Negative.   Respiratory: Positive for cough. Negative for hemoptysis, sputum  production, shortness of breath and wheezing.   Cardiovascular: Negative for chest pain, palpitations, orthopnea, claudication, leg swelling and PND.  Gastrointestinal: Negative for abdominal pain, diarrhea, heartburn, nausea and vomiting.  Genitourinary: Negative.   Musculoskeletal: Negative for joint pain and myalgias.  Skin: Negative for itching.  Neurological: Negative for dizziness, weakness and headaches.  Endo/Heme/Allergies: Negative.   Psychiatric/Behavioral: Negative.    Objective:   Vitals:   09/27/20 1538  BP: 122/74  Pulse: 71  Temp: (!) 97.3 F (36.3 C)  TempSrc: Temporal  SpO2: 98%  Weight: 139 lb 4.8 oz (63.2 kg)  Height: 5\' 4"  (1.626 m)     Physical Exam Constitutional:      General: She is not in acute distress.    Appearance: Normal appearance. She is normal weight. She is not ill-appearing.  HENT:     Head: Normocephalic and atraumatic.     Mouth/Throat:     Mouth: Mucous membranes are moist.     Pharynx: Oropharynx is clear.  Eyes:     General: No scleral icterus.    Conjunctiva/sclera: Conjunctivae normal.     Pupils: Pupils are equal, round, and reactive to light.  Cardiovascular:     Rate and Rhythm: Normal rate and regular rhythm.     Pulses: Normal pulses.     Heart sounds: Normal heart sounds. No murmur heard.   Pulmonary:     Effort: Pulmonary effort is normal.     Breath sounds: Normal breath sounds.  Abdominal:     General: Bowel sounds are normal.     Palpations: Abdomen is soft.  Musculoskeletal:     Right lower leg: No edema.     Left lower leg: No edema.  Skin:    General: Skin is warm and dry.  Neurological:     General: No focal deficit present.     Mental Status: She is alert.  Psychiatric:        Mood and Affect: Mood normal.        Behavior: Behavior normal.        Thought Content: Thought content normal.        Judgment: Judgment normal.     CBC    Component Value Date/Time   WBC 6.5 07/22/2020 0907   WBC  5.9 07/11/2017 0756   RBC 4.40 07/22/2020 0907   RBC 3.92 07/11/2017 0756   HGB 14.4 07/22/2020 0907   HCT 41.6 07/22/2020 0907   PLT 252 07/22/2020 0907   MCV 95 07/22/2020 0907   MCH 32.7 07/22/2020 0907   MCH 31.6 07/11/2017 0756   MCHC 34.6 07/22/2020 0907   MCHC 32.1 07/11/2017 0756   RDW 11.9 07/22/2020 0907   LYMPHSABS 1.9  07/22/2020 0907   EOSABS 0.1 07/22/2020 0907   BASOSABS 0.0 07/22/2020 0907   BMP Latest Ref Rng & Units 07/22/2020 02/10/2019 01/07/2018  Glucose 65 - 99 mg/dL 89 103(H) 89  BUN 6 - 24 mg/dL 9 12 11   Creatinine 0.57 - 1.00 mg/dL 0.89 0.85 0.88  BUN/Creat Ratio 9 - 23 10 14 13   Sodium 134 - 144 mmol/L 140 140 140  Potassium 3.5 - 5.2 mmol/L 4.7 4.4 4.4  Chloride 96 - 106 mmol/L 102 101 102  CO2 20 - 29 mmol/L 25 22 24   Calcium 8.7 - 10.2 mg/dL 9.5 9.9 9.7   Chest imaging: CXR 07/21/20 The heart size and mediastinal contours are within normal limits. Both lungs are clear. The visualized skeletal structures are Unremarkable.  PFT: No flowsheet data found.    Assessment & Plan:   Chronic cough - Plan: Pulmonary Function Test  Discussion: Sabrina Ho is a a 60 year old woman, former smoker who is referred to pulmonary clinic for chronic cough.  Her cough has improved since initial referral was made to our clinic. She does not have strong clinical history that her cough is related to sinus disease or GERD. She also does not have strong history for cough variant asthma.   We will check pulmonary function testing based on her smoking history to further investigate for any underlying obstructive lung disease. Her chest radiograph does have increased retrosternal airspace potentially consistent with obstructive lung disease. If her pulmonary function testing indicates obstructive lung disease then we will trial inhaler therapy and monitor for symptom improvement of her cough.  Given her cough has improved at this time, the patient does not wish to pursue  any trials of therapy at this time which is reasonable. If her cough were to worsen, I would recommend that she be placed on an antihistamine, PPI and provided an as needed albuterol inhaler.   Follow up as needed.  Freda Jackson, MD [facility] Office: [phone]   See Amion for Pager Details    No current outpatient medications on file.

## 2020-09-27 NOTE — Patient Instructions (Addendum)
We will schedule you for pulmonary function tests at your convenience.   Follow up as needed.

## 2020-10-25 ENCOUNTER — Ambulatory Visit: Payer: BC Managed Care – PPO

## 2020-12-01 ENCOUNTER — Inpatient Hospital Stay: Admission: RE | Admit: 2020-12-01 | Payer: BC Managed Care – PPO | Source: Ambulatory Visit

## 2021-01-23 ENCOUNTER — Ambulatory Visit
Admission: RE | Admit: 2021-01-23 | Discharge: 2021-01-23 | Disposition: A | Discharge status: Home / Self Care | Payer: BC Managed Care – PPO | Source: Ambulatory Visit | Attending: Gynecology | Admitting: Gynecology

## 2021-01-23 ENCOUNTER — Other Ambulatory Visit: Payer: Self-pay

## 2021-01-23 DIAGNOSIS — Z1231 Encounter for screening mammogram for malignant neoplasm of breast: Secondary | ICD-10-CM

## 2021-02-17 ENCOUNTER — Other Ambulatory Visit: Payer: Self-pay

## 2021-02-17 ENCOUNTER — Other Ambulatory Visit (INDEPENDENT_AMBULATORY_CARE_PROVIDER_SITE_OTHER): Payer: BC Managed Care – PPO

## 2021-02-17 ENCOUNTER — Encounter: Payer: Self-pay | Admitting: Family Medicine

## 2021-02-17 ENCOUNTER — Telehealth: Payer: BC Managed Care – PPO | Admitting: Family Medicine

## 2021-02-17 VITALS — Wt 137.0 lb

## 2021-02-17 DIAGNOSIS — R519 Headache, unspecified: Secondary | ICD-10-CM | POA: Diagnosis not present

## 2021-02-17 DIAGNOSIS — J029 Acute pharyngitis, unspecified: Secondary | ICD-10-CM | POA: Diagnosis not present

## 2021-02-17 DIAGNOSIS — J020 Streptococcal pharyngitis: Secondary | ICD-10-CM | POA: Diagnosis not present

## 2021-02-17 LAB — POCT RAPID STREP A (OFFICE): Rapid Strep A Screen: POSITIVE — AB

## 2021-02-17 MED ORDER — CLARITHROMYCIN 250 MG PO TABS: 250.0000 mg | ORAL_TABLET | Freq: Two times a day (BID) | ORAL | 0 refills | Status: DC

## 2021-02-17 NOTE — Progress Notes (Addendum)
   Subjective:  Documentation for virtual audio and video telecommunications through [place] encounter: This is a 2 part visit.  She will come to the office parking lot for testing which I will interpret and discussed with her.  The patient was located in her office. 2 patient identifiers used.  The provider was located in the office. The patient did consent to this visit and is aware of possible charges through their insurance for this visit.  The other persons participating in this telemedicine service were none. Time spent on call was 13 minutes and in review of previous records 20 minutes total.  This virtual service is not related to other E/M service within previous 7 days.   Patient ID: Sabrina Ho, female    DOB: Feb 25, 1961, 60 y.o.   MRN: [id number]  HPI Chief Complaint  Patient presents with  . Sore Throat    Sore throat, started 2 days ago. Doesn't feel good. No covid test   Complains of a 2 day history of sore throat and feeling like her head is in a fog.  Slight headache yesterday. Denies rhinorrhea, nasal congestion, drainage, ear pain, dizziness, chest pain, palpitations, shortness of breath, cough, abdominal pain, nausea, vomiting or diarrhea.  She has been doing salt water gargles.  Reports having a history of recurrent strep throat and questions whether she could have strep.  No difficulty swallowing or breathing.  She has not taken a Covid test.   Vaccinated for Covid     Review of Systems Pertinent positives and negatives in the history of present illness.     Objective:   Physical Exam Wt 137 lb (62.1 kg)   BMI 23.52 kg/m   Alert and oriented and in no acute distress.  Respirations unlabored.  She is speaking in complete sentences without difficulty.  Normal speech.       Assessment & Plan:  Strep pharyngitis  Acute pharyngitis, unspecified etiology - Plan: POC COVID-19 BinaxNow, Novel Coronavirus, NAA ([facility]), POCT rapid strep  A  Acute nonintractable headache, unspecified headache type - Plan: POC COVID-19 BinaxNow, Novel Coronavirus, NAA ([facility]), POCT rapid strep A  Discussed that she most likely has a viral process.  She will come to the office parking lot for rapid and PCR COVID test as well as rapid strep test since she has a history of strep and would like to be tested.  No known exposure.  Symptomatic treatment discussed.  Follow-up pending results.  Addendum: Rapid strep test positive.  I will treat her with antibiotics and she will follow-up with me if she is not back to baseline when she completes them.  I also sent her a MyChart message regarding the contagious nature of this illness.  Antibiotic choice due to penicillin allergy

## 2021-02-17 NOTE — Progress Notes (Signed)
Please let her know her strep test is positive and I will send in an antibiotic to her pharmacy.  Please have her read her MyChart message from me as well.

## 2021-02-17 NOTE — Addendum Note (Signed)
Addended by: Girtha Rm on: 02/17/2021 12:22 PM   Modules accepted: Orders

## 2021-02-18 LAB — SARS-COV-2, NAA 2 DAY TAT

## 2021-02-18 LAB — NOVEL CORONAVIRUS, NAA: SARS-CoV-2, NAA: NOT DETECTED

## 2021-03-08 DIAGNOSIS — H5213 Myopia, bilateral: Secondary | ICD-10-CM | POA: Diagnosis not present

## 2021-03-08 DIAGNOSIS — H25013 Cortical age-related cataract, bilateral: Secondary | ICD-10-CM | POA: Diagnosis not present

## 2021-03-22 ENCOUNTER — Telehealth: Payer: Self-pay | Admitting: Family Medicine

## 2021-03-22 ENCOUNTER — Other Ambulatory Visit: Payer: Self-pay | Admitting: Medical

## 2021-03-22 ENCOUNTER — Other Ambulatory Visit: Payer: Self-pay

## 2021-03-22 ENCOUNTER — Telehealth (INDEPENDENT_AMBULATORY_CARE_PROVIDER_SITE_OTHER): Payer: BC Managed Care – PPO | Admitting: Medical

## 2021-03-22 ENCOUNTER — Encounter: Payer: Self-pay | Admitting: Medical

## 2021-03-22 VITALS — Wt 140.0 lb

## 2021-03-22 DIAGNOSIS — U071 COVID-19: Secondary | ICD-10-CM

## 2021-03-22 DIAGNOSIS — R5383 Other fatigue: Secondary | ICD-10-CM | POA: Diagnosis not present

## 2021-03-22 DIAGNOSIS — R059 Cough, unspecified: Secondary | ICD-10-CM | POA: Diagnosis not present

## 2021-03-22 MED ORDER — MOLNUPIRAVIR EUA 200MG CAPSULE: 4.0000 | ORAL_CAPSULE | Freq: Two times a day (BID) | ORAL | 0 refills | Status: DC

## 2021-03-22 MED ORDER — EMERGEN-C IMMUNE PLUS PO PACK: 1.0000 | PACK | Freq: Two times a day (BID) | ORAL | 0 refills | Status: DC

## 2021-03-22 MED ORDER — MOLNUPIRAVIR EUA 200MG CAPSULE: 4.0000 | ORAL_CAPSULE | Freq: Two times a day (BID) | ORAL | 0 refills | Status: AC

## 2021-03-22 NOTE — Telephone Encounter (Signed)
[facility] called  they do not have any of the covid meds, please send to different  Pharmacy

## 2021-03-22 NOTE — Progress Notes (Signed)
Subjective:     Patient ID: Sabrina Ho, female   DOB: [birth year], 60 y.o.   MRN: [id number]  This visit type was conducted due to national recommendations for restrictions regarding the COVID-19 Pandemic (e.g. social distancing) in an effort to limit this patient's exposure and mitigate transmission in our community.  Due to their co-morbid illnesses, this patient is at least at moderate risk for complications without adequate follow up.  This format is felt to be most appropriate for this patient at this time.    Documentation for virtual audio and video telecommunications through [place] encounter:  The patient was located at home. The provider was located in the office. The patient did consent to this visit and is aware of possible charges through their insurance for this visit.  The other persons participating in this telemedicine service were none. Time spent on call was 20 minutes and in review of previous records 20 minutes total.  This virtual service is not related to other E/M service within previous 7 days.   HPI Chief Complaint  Patient presents with   Covid Positive    03/20/21 positive symptoms started headache, congestion, body aches, cough, no fever, weak   Virtual consult today for COVID illness.  Tested positive on July 4.  Symptoms began on July 4.  She notes right ear symptoms as above including bad headache initially, some body aches that have improved some, cough, congestion, weak feeling, back ache, sleeping a lot, some diarrhea this morning.  No nausea, no vomiting.   Drinking a lot of water.  Appetite down.  No urinary symptoms.  Denies hx/o frequent infections or difficulty getting over illness.  She had sick contact that had covid in the family.  Using ibuprofen.  Not on any cough or congestion medication.  She had the johnson initial vaccine, no booster.  No other aggravating or relieving factors. No other complaint.   Past Medical History:  Diagnosis Date    Hemochromatosis    No current outpatient medications on file prior to visit.   No current facility-administered medications on file prior to visit.     Review of Systems As in subjective    Objective:   Physical Exam Due to coronavirus pandemic stay at home measures, patient visit was virtual and they were not examined in person.   Wt 140 lb (63.5 kg)   BMI 24.03 kg/m   Gen: nad, No obvious dyspnea or wheezing, no labored breathing     Assessment:     Encounter Diagnoses  Name Primary?   COVID-19 virus infection Yes   Cough    Fatigue, unspecified type        Plan:     General recommendations: I recommend you rest, hydrate well with water and clear fluids throughout the day.   You can use Tylenol for pain or fever You can use over the counter Delsym or Robitussin DM for cough. You can use over the counter Emetrol for nausea.    Begin prescription below for symptoms and risk reduction of complications and hospitalization risk.    If you are having trouble breathing, if you are very weak, have high fever 103 or higher consistently despite Tylenol, or uncontrollable nausea and vomiting, then call or go to the emergency department.    If you have other questions or have other symptoms or questions you are concerned about then please make a virtual visit  Covid symptoms such as fatigue and cough can linger over 2 weeks,  even after the initial fever, aches, chills, and other initial symptoms.   Self Quarantine: The CDC, Centers for Disease Control has recommended a self quarantine of 5 days from the start of your illness until you are symptom-free including at least 24 hours of no symptoms including no fever, no shortness of breath, and no body aches and chills, by day 5 before returning to work or general contact with the public.  What does self quarantine mean: avoiding contact with people as much as possible.   Particularly in your house, isolate your self from  others in a separate room, wear a mask when possible in the room, particularly if coughing a lot.   Have others bring food, water, medications, etc., to your door, but avoid direct contact with your household contacts during this time to avoid spreading the infection to them.   If you have a separate bathroom and living quarters during the next 2 weeks away from others, that would be preferable.    If you can't completely isolate, then wear a mask, wash hands frequently with soap and water for at least 15 seconds, minimize close contact with others, and have a friend or family member check regularly from a distance to make sure you are not getting seriously worse.     You should not be going out in public, should not be going to stores, to work or other public places until all your symptoms have resolved and at least 5 days + 24 hours of no symptoms at all have transpired.   Ideally you should avoid contact with others for a full 5 days if possible.  One of the goals is to limit spread to high risk people; people that are older and elderly, people with multiple health issues like diabetes, heart disease, lung disease, and anybody that has weakened immune systems such as people with cancer or on immunosuppressive therapy.       Sabrina Ho was seen today for covid positive.  Diagnoses and all orders for this visit:  COVID-19 virus infection  Cough  Fatigue, unspecified type  Other orders -     Multiple Vitamins-Minerals (EMERGEN-C IMMUNE PLUS) PACK; Take 1 tablet by mouth 2 (two) times daily. -     molnupiravir EUA 200 mg CAPS; Take 4 capsules (800 mg total) by mouth 2 (two) times daily for 5 days. F/u prn

## 2021-03-22 NOTE — Telephone Encounter (Signed)
Pt advised and said this was the correct pharmacy

## 2021-03-22 NOTE — Telephone Encounter (Signed)
Pt called back and said [facility] [address] is too far away and wants to see if both medications can be sent to [facility] [address]. I called this pharmacy and they do have both medications in stock. For the Emergen-C  the pharmacy tech said just make sure when you send it that it says it can be substituted because they have to pull it off the shelf.

## 2021-03-22 NOTE — Addendum Note (Signed)
Addended by: Carlena Hurl on: 03/22/2021 01:08 PM   Modules accepted: Orders

## 2021-03-28 ENCOUNTER — Telehealth: Payer: BC Managed Care – PPO | Admitting: Family Medicine

## 2021-03-28 ENCOUNTER — Encounter: Payer: Self-pay | Admitting: Family Medicine

## 2021-03-28 VITALS — Wt 131.0 lb

## 2021-03-28 DIAGNOSIS — N3000 Acute cystitis without hematuria: Secondary | ICD-10-CM | POA: Diagnosis not present

## 2021-03-28 MED ORDER — NITROFURANTOIN MONOHYD MACRO 100 MG PO CAPS: 100.0000 mg | ORAL_CAPSULE | Freq: Two times a day (BID) | ORAL | 0 refills | Status: DC

## 2021-03-28 NOTE — Patient Instructions (Signed)
Drink plenty of water. Take the antibiotic twice daily for 5 days. Don't use the Azo for more than 2-3 days. Follow-up if your symptoms don't completely resolve. Contact us if you can't tolerate the medication. Definitely let us know if you develop fever, vomiting, back/flank pain or other new symptoms,  I hope you feel better soon!  Urinary Tract Infection, Adult  A urinary tract infection (UTI) is an infection of any part of the urinary tract. The urinary tract includes the kidneys, ureters, bladder, and urethra.These organs make, store, and get rid of urine in the body. An upper UTI affects the ureters and kidneys. A lower UTI affects the bladderand urethra. What are the causes? Most urinary tract infections are caused by bacteria in your genital area around your urethra, where urine leaves your body. These bacteria grow andcause inflammation of your urinary tract. What increases the risk? You are more likely to develop this condition if: You have a urinary catheter that stays in place. You are not able to control when you urinate or have a bowel movement (incontinence). You are female and you: Use a spermicide or diaphragm for birth control. Have low estrogen levels. Are pregnant. You have certain genes that increase your risk. You are sexually active. You take antibiotic medicines. You have a condition that causes your flow of urine to slow down, such as: An enlarged prostate, if you are female. Blockage in your urethra. A kidney stone. A nerve condition that affects your bladder control (neurogenic bladder). Not getting enough to drink, or not urinating often. You have certain medical conditions, such as: Diabetes. A weak disease-fighting system (immunesystem). Sickle cell disease. Gout. Spinal cord injury. What are the signs or symptoms? Symptoms of this condition include: Needing to urinate right away (urgency). Frequent urination. This may include small amounts of  urine each time you urinate. Pain or burning with urination. Blood in the urine. Urine that smells bad or unusual. Trouble urinating. Cloudy urine. Vaginal discharge, if you are female. Pain in the abdomen or the lower back. You may also have: Vomiting or a decreased appetite. Confusion. Irritability or tiredness. A fever or chills. Diarrhea. The first symptom in older adults may be confusion. In some cases, they may nothave any symptoms until the infection has worsened. How is this diagnosed? This condition is diagnosed based on your medical history and a physical exam. You may also have other tests, including: Urine tests. Blood tests. Tests for STIs (sexually transmitted infections). If you have had more than one UTI, a cystoscopy or imaging studies may be doneto determine the cause of the infections. How is this treated? Treatment for this condition includes: Antibiotic medicine. Over-the-counter medicines to treat discomfort. Drinking enough water to stay hydrated. If you have frequent infections or have other conditions such as a kidney stone, you may need to see a health care provider who specializes in the urinary tract (urologist). In rare cases, urinary tract infections can cause sepsis. Sepsis is a life-threatening condition that occurs when the body responds to an infection. Sepsis is treated in the hospital with IV antibiotics, fluids, and othermedicines. Follow these instructions at home:  Medicines Take over-the-counter and prescription medicines only as told by your health care provider. If you were prescribed an antibiotic medicine, take it as told by your health care provider. Do not stop using the antibiotic even if you start to feel better. General instructions Make sure you: Empty your bladder often and completely. Do not hold urine for  long periods of time. Empty your bladder after sex. Wipe from front to back after urinating or having a bowel movement if  you are female. Use each tissue only one time when you wipe. Drink enough fluid to keep your urine pale yellow. Keep all follow-up visits. This is important. Contact a health care provider if: Your symptoms do not get better after 1-2 days. Your symptoms go away and then return. Get help right away if: You have severe pain in your back or your lower abdomen. You have a fever or chills. You have nausea or vomiting. Summary A urinary tract infection (UTI) is an infection of any part of the urinary tract, which includes the kidneys, ureters, bladder, and urethra. Most urinary tract infections are caused by bacteria in your genital area. Treatment for this condition often includes antibiotic medicines. If you were prescribed an antibiotic medicine, take it as told by your health care provider. Do not stop using the antibiotic even if you start to feel better. Keep all follow-up visits. This is important. This information is not intended to replace advice given to you by your health care provider. Make sure you discuss any questions you have with your healthcare provider. Document Revised: 04/15/2020 Document Reviewed: 04/15/2020 Elsevier Patient Education  [place].

## 2021-03-28 NOTE — Progress Notes (Addendum)
Start time: 10:17 End time: 10:34  Virtual Visit via Video Note  I connected with Sabrina Ho on 03/28/21 by a video enabled telemedicine application and verified that I am speaking with the correct person using two identifiers.  Location: Patient: at work, alone Provider: home office   I discussed the limitations of evaluation and management by telemedicine and the availability of in person appointments. The patient expressed understanding and agreed to proceed.  History of Present Illness:  Chief Complaint  Patient presents with   other    Started yesterday pain and pressure but nothing came out, tried azo OTC and that helped some,    Yesterday she had urinary urgency, but not much would come out. Started in the morning. She called office, couldn't be seen in office until tomorrow. She spoke with her nurse daughter said sounded like UTI, could try Azo until visit. She got Azo yesterday afternoon, and it helped a lot. No longer feeling pressure, able to go to the bathroom. Daughter told her she still needed to be seen, for antibiotic.   Denies any hematuria, noted slight odor, not cloudy, not dark. Denies abdominal pain, vaginal discharge   No known UTI in any recent years.   PMH, PSH, SH reviewed  Outpatient Encounter Medications as of 03/28/2021  Medication Sig   Multiple Vitamins-Minerals (EMERGEN-C IMMUNE PLUS) PACK Take 1 tablet by mouth 2 (two) times daily.   No facility-administered encounter medications on file as of 03/28/2021.   She is also taking Robitussin DM Taking AZO currently  Allergies  Allergen Reactions   Vancomycin Itching    Itching of scalp   Amoxicillin Rash    Has patient had a PCN reaction causing immediate rash, facial/tongue/throat swelling, SOB or lightheadedness with hypotension: Yes Has patient had a PCN reaction causing severe rash involving mucus membranes or skin necrosis: No Has patient had a PCN reaction that required  hospitalization: No Has patient had a PCN reaction occurring within the last 10 years: No If all of the above answers are "NO", then may proceed with Cephalosporin use.     ROS: Denies flank pain, f/c/n/v.  Urinary symptoms per HPI, better now that she has taken AZO. No vaginal discharge, abdominal pain. Some residual cough from COVID. No other concerns.     Observations/Objective:  Wt 131 lb (59.4 kg)   BMI 22.49 kg/m   Pleasant, well-appearing female, in no distress She is alert, oriented. Cranial nerves grossly intact. Exam is limited due to virtual nature of the visit   Assessment and Plan:  Acute cystitis without hematuria - Plan: nitrofurantoin, macrocrystal-monohydrate, (MACROBID) 100 MG capsule  Discussed limitations without having u/a or culture. Will treat presumptively for UTI. F/u if not improving   Follow Up Instructions:    I discussed the assessment and treatment plan with the patient. The patient was provided an opportunity to ask questions and all were answered. The patient agreed with the plan and demonstrated an understanding of the instructions.   The patient was advised to call back or seek an in-person evaluation if the symptoms worsen or if the condition fails to improve as anticipated.  I spent 20 minutes dedicated to the care of this patient, including pre-visit review of records, face to face time, post-visit ordering of testing and documentation.    Vikki Ports, MD

## 2021-03-29 ENCOUNTER — Ambulatory Visit: Payer: BC Managed Care – PPO | Admitting: Medical

## 2021-04-18 DIAGNOSIS — Z86010 Personal history of colonic polyps: Secondary | ICD-10-CM | POA: Diagnosis not present

## 2021-06-15 DIAGNOSIS — Z86010 Personal history of colonic polyps: Secondary | ICD-10-CM | POA: Diagnosis not present

## 2021-06-15 DIAGNOSIS — Z1211 Encounter for screening for malignant neoplasm of colon: Secondary | ICD-10-CM | POA: Diagnosis not present

## 2021-07-27 DIAGNOSIS — Z85828 Personal history of other malignant neoplasm of skin: Secondary | ICD-10-CM | POA: Diagnosis not present

## 2021-07-27 DIAGNOSIS — D692 Other nonthrombocytopenic purpura: Secondary | ICD-10-CM | POA: Diagnosis not present

## 2021-07-27 DIAGNOSIS — D2261 Melanocytic nevi of right upper limb, including shoulder: Secondary | ICD-10-CM | POA: Diagnosis not present

## 2021-07-27 DIAGNOSIS — D2271 Melanocytic nevi of right lower limb, including hip: Secondary | ICD-10-CM | POA: Diagnosis not present

## 2021-08-31 DIAGNOSIS — B349 Viral infection, unspecified: Secondary | ICD-10-CM | POA: Diagnosis not present

## 2021-12-26 ENCOUNTER — Other Ambulatory Visit: Payer: Self-pay | Admitting: Gynecology

## 2021-12-26 DIAGNOSIS — Z1231 Encounter for screening mammogram for malignant neoplasm of breast: Secondary | ICD-10-CM

## 2022-01-24 ENCOUNTER — Ambulatory Visit
Admission: RE | Admit: 2022-01-24 | Discharge: 2022-01-24 | Disposition: A | Discharge status: Home / Self Care | Payer: BC Managed Care – PPO | Source: Ambulatory Visit | Attending: Gynecology | Admitting: Gynecology

## 2022-01-24 DIAGNOSIS — Z1231 Encounter for screening mammogram for malignant neoplasm of breast: Secondary | ICD-10-CM | POA: Diagnosis not present

## 2022-02-05 ENCOUNTER — Ambulatory Visit: Payer: BC Managed Care – PPO | Admitting: Physician Assistant

## 2022-02-05 ENCOUNTER — Encounter: Payer: Self-pay | Admitting: Physician Assistant

## 2022-02-05 VITALS — BP 130/80 | HR 72 | Ht 64.0 in | Wt 143.0 lb

## 2022-02-05 DIAGNOSIS — S335XXA Sprain of ligaments of lumbar spine, initial encounter: Secondary | ICD-10-CM | POA: Diagnosis not present

## 2022-02-05 DIAGNOSIS — M545 Low back pain, unspecified: Secondary | ICD-10-CM | POA: Diagnosis not present

## 2022-02-05 MED ORDER — METHYLPREDNISOLONE 4 MG PO TBPK: ORAL_TABLET | ORAL | 0 refills | Status: DC

## 2022-02-05 NOTE — Patient Instructions (Addendum)
You will get a call to schedule an appointment with Orthopedics  Take Medrol Dosepak with food, do not take any NSAIDS (advil, motrin, ibuprofen, aspirin, aleve), while taking it   You can walk in for x-ray at ---  [facility] [address] [place], [place] [address]  Phone [phone] Fax [phone]  Hours of Operation General hours of operation are Monday - Friday, 8 am-5 pm

## 2022-02-05 NOTE — Progress Notes (Signed)
Acute Office Visit  Subjective:    Patient ID: Sabrina Ho, female    DOB: 1961-09-10, 61 y.o.   MRN: [id number]  Chief Complaint  Patient presents with   Acute Visit    Hurt her back last week. She is having pain on the lower right side.    HPI Patient is in today for evaluation of low back pain; was leaning over 6 days ago while out of town on vacation and felt a sharp pain across enire lower back that shot up her back; denies numbness and tingling shooting down her legs; motrin was of minimal help; aleve and a heating pad help more and back pain is better today; denies loss of control of bowels or bladder; denies abdominal pain; denies having muscle spasms and is able to lie down at night and sleep comfortably; reports that she was diagnosed with a herniated disc many years ago   Outpatient Medications Prior to Visit  Medication Sig Dispense Refill   Multiple Vitamins-Minerals (EMERGEN-C IMMUNE PLUS) PACK Take 1 tablet by mouth 2 (two) times daily. (Patient not taking: Reported on 02/05/2022) 10 each 0   nitrofurantoin, macrocrystal-monohydrate, (MACROBID) 100 MG capsule Take 1 capsule (100 mg total) by mouth 2 (two) times daily. (Patient not taking: Reported on 02/05/2022) 10 capsule 0   No facility-administered medications prior to visit.    Allergies  Allergen Reactions   Vancomycin Itching    Itching of scalp   Amoxicillin Rash    Has patient had a PCN reaction causing immediate rash, facial/tongue/throat swelling, SOB or lightheadedness with hypotension: Yes Has patient had a PCN reaction causing severe rash involving mucus membranes or skin necrosis: No Has patient had a PCN reaction that required hospitalization: No Has patient had a PCN reaction occurring within the last 10 years: No If all of the above answers are "NO", then may proceed with Cephalosporin use.     Review of Systems  Constitutional:  Negative for activity change and chills.  HENT:  Negative for  congestion and voice change.   Eyes:  Negative for pain and redness.  Respiratory:  Negative for cough and wheezing.   Cardiovascular:  Negative for chest pain.  Gastrointestinal:  Negative for constipation, diarrhea, nausea and vomiting.  Endocrine: Negative for polyuria.  Genitourinary:  Negative for frequency.  Musculoskeletal:  Positive for arthralgias, back pain and myalgias.  Skin:  Negative for color change and rash.  Allergic/Immunologic: Negative for immunocompromised state.  Neurological:  Negative for dizziness and numbness.  Psychiatric/Behavioral:  Negative for agitation.       Objective:    Physical Exam Vitals and nursing note reviewed.  Constitutional:      General: She is not in acute distress.    Appearance: Normal appearance. She is not ill-appearing.  HENT:     Head: Normocephalic and atraumatic.     Right Ear: External ear normal.     Left Ear: External ear normal.     Nose: No congestion.  Eyes:     Extraocular Movements: Extraocular movements intact.     Conjunctiva/sclera: Conjunctivae normal.     Pupils: Pupils are equal, round, and reactive to light.  Cardiovascular:     Rate and Rhythm: Normal rate and regular rhythm.     Pulses: Normal pulses.     Heart sounds: Normal heart sounds.  Pulmonary:     Effort: Pulmonary effort is normal.     Breath sounds: Normal breath sounds. No wheezing.  Abdominal:  General: Bowel sounds are normal.     Palpations: Abdomen is soft.  Musculoskeletal:        General: Normal range of motion.     Cervical back: Normal range of motion and neck supple.     Right lower leg: No edema.     Left lower leg: No edema.  Skin:    General: Skin is warm and dry.     Findings: No bruising.  Neurological:     General: No focal deficit present.     Mental Status: She is alert and oriented to person, place, and time.  Psychiatric:        Mood and Affect: Mood normal.        Behavior: Behavior normal.        Thought  Content: Thought content normal.    BP 130/80   Pulse 72   Wt 143 lb (64.9 kg)   SpO2 99%   BMI 24.55 kg/m   Wt Readings from Last 3 Encounters:  02/05/22 143 lb (64.9 kg)  03/28/21 131 lb (59.4 kg)  03/22/21 140 lb (63.5 kg)    Results for orders placed or performed in visit on 02/17/21  Novel Coronavirus, NAA ([facility])   Specimen: Nasopharyngeal(NP) swabs in vial transport medium  Result Value Ref Range   SARS-CoV-2, NAA Not Detected Not Detected  SARS-COV-2, NAA 2 DAY TAT  Result Value Ref Range   SARS-CoV-2, NAA 2 DAY TAT Performed   POCT rapid strep A  Result Value Ref Range   Rapid Strep A Screen Positive (A) Negative       Assessment & Plan:  1. Acute bilateral low back pain without sciatica - DG Lumbar Spine 2-3 Views; Future - AMB referral to orthopedics  2. Sprain of low back, initial encounter - DG Lumbar Spine 2-3 Views; Future - AMB referral to orthopedics  Tylenol (generic is acetamenophen)  Advil or Motrin (generic is ibuprofen) ALWAYS TAKE WITH FOOD Aleve (generic is naprosyn sodium) ALWAYS TAKE WITH FOOD  Aspercreme with lidocaine Muscle rubs like Biofreeze, IcyHot, Bengay, pain patches like SalonPas  Voltaren gel (generic is Diclofenac sodium)  Back exercises and referral to Ortho for follow up ordered  Meds ordered this encounter  Medications   methylPREDNISolone (MEDROL DOSEPAK) 4 MG TBPK tablet    Sig: Take as directed    Dispense:  21 tablet    Refill:  0    Order Specific Question:   Supervising Provider    Answer:   Denita Lung [[id number]]    Return in about 6 months (around 08/08/2022) for Return for Annual Exam with PCP Jimmye Norman.  Irene Pap, PA-C

## 2022-02-16 ENCOUNTER — Other Ambulatory Visit: Payer: BC Managed Care – PPO

## 2022-03-13 DIAGNOSIS — H5213 Myopia, bilateral: Secondary | ICD-10-CM | POA: Diagnosis not present

## 2022-03-13 DIAGNOSIS — H25013 Cortical age-related cataract, bilateral: Secondary | ICD-10-CM | POA: Diagnosis not present

## 2022-03-16 ENCOUNTER — Encounter: Payer: Self-pay | Admitting: Internal Medicine

## 2022-05-23 ENCOUNTER — Encounter: Payer: Self-pay | Admitting: Internal Medicine

## 2022-06-26 ENCOUNTER — Encounter: Payer: Self-pay | Admitting: Internal Medicine

## 2022-07-23 ENCOUNTER — Other Ambulatory Visit: Payer: Self-pay

## 2022-07-23 ENCOUNTER — Encounter (HOSPITAL_BASED_OUTPATIENT_CLINIC_OR_DEPARTMENT_OTHER): Payer: Self-pay | Admitting: Emergency Medicine

## 2022-07-23 DIAGNOSIS — Z23 Encounter for immunization: Secondary | ICD-10-CM | POA: Insufficient documentation

## 2022-07-23 DIAGNOSIS — S01511A Laceration without foreign body of lip, initial encounter: Secondary | ICD-10-CM | POA: Insufficient documentation

## 2022-07-23 DIAGNOSIS — Z87891 Personal history of nicotine dependence: Secondary | ICD-10-CM | POA: Insufficient documentation

## 2022-07-23 DIAGNOSIS — W540XXA Bitten by dog, initial encounter: Secondary | ICD-10-CM | POA: Diagnosis not present

## 2022-07-23 DIAGNOSIS — S01551A Open bite of lip, initial encounter: Secondary | ICD-10-CM | POA: Insufficient documentation

## 2022-07-23 DIAGNOSIS — S0993XA Unspecified injury of face, initial encounter: Secondary | ICD-10-CM | POA: Diagnosis not present

## 2022-07-23 NOTE — ED Triage Notes (Signed)
Leaned down to show affection to her elderly dog, accidentally startled the dog and was bitten on face. Arrives with laceration to the L lip, crosses the vermilion border. Her dog is up to date on vaccines. Last tetanus unknown.

## 2022-07-24 ENCOUNTER — Emergency Department (HOSPITAL_BASED_OUTPATIENT_CLINIC_OR_DEPARTMENT_OTHER)
Admission: EM | Admit: 2022-07-24 | Discharge: 2022-07-24 | Disposition: A | Discharge status: Home / Self Care | Payer: BC Managed Care – PPO | Attending: Emergency Medicine | Admitting: Emergency Medicine

## 2022-07-24 DIAGNOSIS — S01511A Laceration without foreign body of lip, initial encounter: Secondary | ICD-10-CM

## 2022-07-24 DIAGNOSIS — W540XXA Bitten by dog, initial encounter: Secondary | ICD-10-CM

## 2022-07-24 MED ORDER — TETANUS-DIPHTH-ACELL PERTUSSIS 5-2.5-18.5 LF-MCG/0.5 IM SUSY
0.5000 mL | PREFILLED_SYRINGE | Freq: Once | INTRAMUSCULAR | Status: AC
  Administered 2022-07-24: 0.5 mL via INTRAMUSCULAR
  Filled 2022-07-24: qty 0.5

## 2022-07-24 MED ORDER — DOXYCYCLINE HYCLATE 100 MG PO TABS
100.0000 mg | ORAL_TABLET | Freq: Once | ORAL | Status: AC
  Administered 2022-07-24: 100 mg via ORAL
  Filled 2022-07-24: qty 1

## 2022-07-24 MED ORDER — LIDOCAINE-EPINEPHRINE (PF) 2 %-1:200000 IJ SOLN
10.0000 mL | Freq: Once | INTRAMUSCULAR | Status: AC
  Administered 2022-07-24: 10 mL via INTRADERMAL
  Filled 2022-07-24: qty 20

## 2022-07-24 MED ORDER — DOXYCYCLINE HYCLATE 100 MG PO CAPS: 100.0000 mg | ORAL_CAPSULE | Freq: Two times a day (BID) | ORAL | 0 refills | Status: DC

## 2022-07-24 NOTE — ED Notes (Signed)
Pt verbalizes understanding of discharge instructions. Opportunity for questioning and answers were provided. Pt discharged from ED to home with husband.    

## 2022-07-24 NOTE — ED Provider Notes (Signed)
[facility] Provider Note: Georgena Spurling, MD, FACEP  CSN: [id number] MRN: [id number] ARRIVAL: 07/23/22 at 2243 ROOM: [place] PRESENT ILLNESS  07/24/22 3:31 AM Sabrina Ho is a 61 y.o. female who was bitten on her face by her own dog yesterday evening.  She has a laceration to her left lower lip.  She rates associated pain as a 2 out of 10.  The dog is up-to-date on its immunizations.  She is not up-to-date on her tetanus.   Past Medical History:  Diagnosis Date   Hemochromatosis     Past Surgical History:  Procedure Laterality Date   ABDOMINAL HYSTERECTOMY     BREAST CYST EXCISION Left    BREAST LUMPECTOMY WITH RADIOACTIVE SEED LOCALIZATION Left 08/15/2015   Procedure: BREAST LUMPECTOMY WITH RADIOACTIVE SEED LOCALIZATION;  Surgeon: Autumn Messing III, MD;  Location: [facility];  Service: General;  Laterality: Left;   CESAREAN SECTION     CHOLECYSTECTOMY     ERCP N/A 07/10/2017   Procedure: ENDOSCOPIC RETROGRADE CHOLANGIOPANCREATOGRAPHY (ERCP);  Surgeon: Gatha Mayer, MD;  Location: [facility] ENDOSCOPY;  Service: Endoscopy;  Laterality: N/A;   TUBAL LIGATION      Family History  Problem Relation Age of Onset   Hypertension Mother    Gallbladder disease Mother        open cholecystectomy in mid-life.     Thyroid disease Mother    Prostate cancer Father    Hypertension Father    Cancer Sister        anal    Heart defect Sister    Breast cancer Niece 71    Social History   Tobacco Use   Smoking status: Former    Packs/day: 1.00    Years: 25.00    Total pack years: 25.00    Types: Cigarettes    Quit date: 09/17/2000    Years since quitting: 21.8   Smokeless tobacco: Former    Quit date: 05/18/2001  Vaping Use   Vaping Use: Never used  Substance Use Topics   Alcohol use: No   Drug use: No    Prior to Admission medications   Not on File    Allergies Vancomycin and Amoxicillin   REVIEW OF  SYSTEMS  Negative except as noted here or in the History of Present Illness.   PHYSICAL EXAMINATION  Initial Vital Signs Blood pressure (!) 161/78, pulse 84, temperature 98.1 F (36.7 C), resp. rate 18, weight 61.2 kg, SpO2 98 %.  Examination General: Well-developed, well-nourished female in no acute distress; appearance consistent with age of record HENT: normocephalic; laceration of left upper lip crossing the vermilion border:    Eyes: Normal appearance Neck: supple Heart: regular rate and rhythm Lungs: clear to auscultation bilaterally Abdomen: soft; nondistended; nontender; bowel sounds present Extremities: No deformity; full range of motion Neurologic: Awake, alert and oriented; motor function intact in all extremities and symmetric; no facial droop Skin: Warm and dry Psychiatric: Normal mood and affect   RESULTS  Summary of this visit's results, reviewed and interpreted by myself:   EKG Interpretation  Date/Time:    Ventricular Rate:    PR Interval:    QRS Duration:   QT Interval:    QTC Calculation:   R Axis:     Text Interpretation:         Laboratory Studies: No results found for this or any previous visit (from the past 24 hour(s)). Imaging  Studies: No results found.  ED COURSE and MDM  Nursing notes, initial and subsequent vitals signs, including pulse oximetry, reviewed and interpreted by myself.  Vitals:   07/23/22 2259 07/23/22 2301 07/24/22 0345  BP: (!) 161/78  (!) 144/86  Pulse: 84  73  Resp: 18  18  Temp: 98.1 F (36.7 C)    SpO2: 98%  99%  Weight:  61.2 kg    Medications  Tdap (BOOSTRIX) injection 0.5 mL (0.5 mLs Intramuscular Given 07/24/22 0341)  lidocaine-EPINEPHrine (XYLOCAINE W/EPI) 2 %-1:200000 (PF) injection 10 mL (10 mLs Intradermal Given 07/24/22 0343)  doxycycline (VIBRA-TABS) tablet 100 mg (100 mg Oral Given 07/24/22 0357)   We will treat with doxycycline for infection prophylaxis since patient is allergic to  Augmentin.   PROCEDURES  Procedures LACERATION REPAIR Performed by: Karen Chafe Signora Zucco Authorized by: Karen Chafe Emmalyn Hinson Consent: Verbal consent obtained. Risks and benefits: risks, benefits and alternatives were discussed Consent given by: patient Patient identity confirmed: provided demographic data Prepped and Draped in normal sterile fashion Wound explored  Laceration Location: Left upper lip  Laceration Length: 1.5 cm (irregular)  No Foreign Bodies seen or palpated  Anesthesia: local infiltration  Local anesthetic: lidocaine 2% with epinephrine  Anesthetic total: 1 ml  Irrigation method: syringe Amount of cleaning: standard  Skin closure: 5-0 Vicryl Rapide  Number of sutures: 4  Technique: Simple interrupted  Patient tolerance: Patient tolerated the procedure well with no immediate complications.    ED DIAGNOSES     ICD-10-CM   1. Dog bite of vermilion of upper lip, initial encounter  S01.551A    W54.0XXA     2. Laceration of vermilion border of upper lip, initial encounter  S01.511A          Shanon Rosser, MD 07/24/22 [phone]

## 2022-07-31 ENCOUNTER — Encounter: Payer: Self-pay | Admitting: Internal Medicine

## 2022-08-02 DIAGNOSIS — D692 Other nonthrombocytopenic purpura: Secondary | ICD-10-CM | POA: Diagnosis not present

## 2022-08-02 DIAGNOSIS — L570 Actinic keratosis: Secondary | ICD-10-CM | POA: Diagnosis not present

## 2022-08-02 DIAGNOSIS — D225 Melanocytic nevi of trunk: Secondary | ICD-10-CM | POA: Diagnosis not present

## 2022-08-02 DIAGNOSIS — C44619 Basal cell carcinoma of skin of left upper limb, including shoulder: Secondary | ICD-10-CM | POA: Diagnosis not present

## 2022-08-02 DIAGNOSIS — L814 Other melanin hyperpigmentation: Secondary | ICD-10-CM | POA: Diagnosis not present

## 2022-08-02 DIAGNOSIS — Z85828 Personal history of other malignant neoplasm of skin: Secondary | ICD-10-CM | POA: Diagnosis not present

## 2022-08-07 ENCOUNTER — Encounter: Payer: Self-pay | Admitting: Nurse Practitioner

## 2022-08-07 ENCOUNTER — Ambulatory Visit: Payer: BC Managed Care – PPO | Admitting: Nurse Practitioner

## 2022-08-07 VITALS — BP 110/68 | HR 82 | Ht 64.75 in | Wt 138.4 lb

## 2022-08-07 DIAGNOSIS — N952 Postmenopausal atrophic vaginitis: Secondary | ICD-10-CM | POA: Insufficient documentation

## 2022-08-07 DIAGNOSIS — E7800 Pure hypercholesterolemia, unspecified: Secondary | ICD-10-CM

## 2022-08-07 DIAGNOSIS — Z23 Encounter for immunization: Secondary | ICD-10-CM | POA: Diagnosis not present

## 2022-08-07 DIAGNOSIS — Z0000 Encounter for general adult medical examination without abnormal findings: Secondary | ICD-10-CM | POA: Diagnosis not present

## 2022-08-07 DIAGNOSIS — E559 Vitamin D deficiency, unspecified: Secondary | ICD-10-CM

## 2022-08-07 DIAGNOSIS — R053 Chronic cough: Secondary | ICD-10-CM

## 2022-08-07 DIAGNOSIS — Z780 Asymptomatic menopausal state: Secondary | ICD-10-CM | POA: Insufficient documentation

## 2022-08-07 MED ORDER — MONTELUKAST SODIUM 10 MG PO TABS: 10.0000 mg | ORAL_TABLET | Freq: Every day | ORAL | 3 refills | Status: AC

## 2022-08-07 NOTE — Assessment & Plan Note (Signed)

## 2022-08-07 NOTE — Patient Instructions (Addendum)
I recommend aquaphor on your lip to help keep it soft and moist.   If the montelukast does not seem to help in the next couple of weeks, send me a message and we can try something else. Please let me know if you would like to have the referral to an Allergist or ENT at any time.

## 2022-08-07 NOTE — Assessment & Plan Note (Signed)
Chronic. Etiology unknown at this time. She has tried and failed treatment for GERD and allergens, although all options have not been exhausted. Discussed option of change in oral allergy medication, use of nasal spray, such as azelastine, referral to allergy, referral to ENT, or different PPE. At this time joint decision was made to trial montelukast to see if symptoms are related to allergy/asthma exacerbation. She will monitor closely for changes and let me know if the medication does not seem effective.

## 2022-08-07 NOTE — Assessment & Plan Note (Signed)
Chronic. Will monitor labs today. Recommend dietary changes with low saturated fat and high fiber diet. Will make changes to plan of care as necessary based on labs.

## 2022-08-07 NOTE — Progress Notes (Signed)
BP 110/68   Pulse 82   Ht 5' 4.75" (1.645 m)   Wt 138 lb 6.4 oz (62.8 kg)   BMI 23.21 kg/m    Subjective:    Patient ID: [name], female    DOB: May 02, 1961, 61 y.o.   MRN: [id number]  HPI: Sabrina Ho is a 61 y.o. female presenting on 08/07/2022 for comprehensive medical examination.   Current medical concerns include: Dry cough- chronic, ongoing for several years.  She went with her previous PCP to try to determine the etiology with allergy medication and medication for reflux with no significant success.  She reports that the dry cough is still present on a routine basis..  She does feel that eating may make it worse.  She reports regular vision exams q1-5y: Yes  She reports regular dental exams q 21m  Yes  The patient eats a regular, healthy diet. She endorses exercise and/or activity of:  daily walking at work and at home with spouse  She endorses the following social history: Occupation: employed Marital Status: married She denies concerns with STI today, testing was not ordered  A comprehensive review of systems was negative.  Most Recent Depression Screen:     08/07/2022   10:22 AM 02/17/2021    8:48 AM 07/21/2020    2:59 PM 02/10/2019    9:43 AM 01/07/2018    8:23 AM  Depression screen PHQ 2/9  Decreased Interest 0 0 0 0 0  Down, Depressed, Hopeless 0 0 0 0 0  PHQ - 2 Score 0 0 0 0 0   Most Recent Anxiety Screen:      No data to display         Most Recent Fall Screen:    08/07/2022   10:22 AM 02/17/2021    8:48 AM 07/21/2020    2:59 PM 02/10/2019    9:43 AM 01/07/2018    8:23 AM  Fall Risk   Falls in the past year? 0 0 0 0 No  Number falls in past yr: 0 0 0 0   Injury with Fall? 0 0 0 0   Risk for fall due to : No Fall Risks No Fall Risks     Follow up Falls evaluation completed Falls evaluation completed       Past medical history, surgical history, medications, allergies, family history and social history reviewed with patient today and  changes made to appropriate areas of the chart.  Past Medical History:  Past Medical History:  Diagnosis Date   Hemochromatosis    Medications:  No current outpatient medications on file prior to visit.   No current facility-administered medications on file prior to visit.   Surgical History:  Past Surgical History:  Procedure Laterality Date   ABDOMINAL HYSTERECTOMY     BREAST CYST EXCISION Left    BREAST LUMPECTOMY WITH RADIOACTIVE SEED LOCALIZATION Left 08/15/2015   Procedure: BREAST LUMPECTOMY WITH RADIOACTIVE SEED LOCALIZATION;  Surgeon: PAutumn MessingIII, MD;  [facility]  Service: General;  Laterality: Left;   CESAREAN SECTION     CHOLECYSTECTOMY     ERCP N/A 07/10/2017   Procedure: ENDOSCOPIC RETROGRADE CHOLANGIOPANCREATOGRAPHY (ERCP);  Surgeon: GGatha Mayer MD;  [facility];  Service: Endoscopy;  Laterality: N/A;   TUBAL LIGATION     Allergies:  Allergies  Allergen Reactions   Vancomycin Itching    Itching of scalp   Amoxicillin Rash    Has patient had a PCN reaction  causing immediate rash, facial/tongue/throat swelling, SOB or lightheadedness with hypotension: Yes Has patient had a PCN reaction causing severe rash involving mucus membranes or skin necrosis: No Has patient had a PCN reaction that required hospitalization: No Has patient had a PCN reaction occurring within the last 10 years: No If all of the above answers are "NO", then may proceed with Cephalosporin use.    Social History:  Social History   Socioeconomic History   Marital status: Married    Spouse name: Not on file   Number of children: Not on file   Years of education: Not on file   Highest education level: Not on file  Occupational History   Not on file  Tobacco Use   Smoking status: Former    Packs/day: 1.00    Years: 25.00    Total pack years: 25.00    Types: Cigarettes    Quit date: 09/17/2000    Years since quitting: 21.9   Smokeless tobacco:  Former    Quit date: 05/18/2001  Vaping Use   Vaping Use: Never used  Substance and Sexual Activity   Alcohol use: No   Drug use: No   Sexual activity: Yes  Other Topics Concern   Not on file  Social History Narrative   Not on file   Social Determinants of Health   Financial Resource Strain: Not on file  Food Insecurity: Not on file  Transportation Needs: Not on file  Physical Activity: Not on file  Stress: Not on file  Social Connections: Not on file  Intimate Partner Violence: Not on file   Social History   Tobacco Use  Smoking Status Former   Packs/day: 1.00   Years: 25.00   Total pack years: 25.00   Types: Cigarettes   Quit date: 09/17/2000   Years since quitting: 21.9  Smokeless Tobacco Former   Quit date: 05/18/2001   Social History   Substance and Sexual Activity  Alcohol Use No   Family History:  Family History  Problem Relation Age of Onset   Hypertension Mother    Gallbladder disease Mother        open cholecystectomy in mid-life.     Thyroid disease Mother    Prostate cancer Father    Hypertension Father    Cancer Sister        anal    Heart defect Sister    Breast cancer Niece 77       Objective:    BP 110/68   Pulse 82   Ht 5' 4.75" (1.645 m)   Wt 138 lb 6.4 oz (62.8 kg)   BMI 23.21 kg/m   Wt Readings from Last 3 Encounters:  08/07/22 138 lb 6.4 oz (62.8 kg)  07/23/22 135 lb (61.2 kg)  02/05/22 143 lb (64.9 kg)    Physical Exam Vitals and nursing note reviewed.  Constitutional:      General: She is not in acute distress.    Appearance: Normal appearance.  HENT:     Head: Normocephalic and atraumatic.     Right Ear: Hearing, tympanic membrane, ear canal and external ear normal.     Left Ear: Hearing, tympanic membrane, ear canal and external ear normal.     Nose: Nose normal.     Right Sinus: No maxillary sinus tenderness or frontal sinus tenderness.     Left Sinus: No maxillary sinus tenderness or frontal sinus tenderness.      Mouth/Throat:     Lips: Pink.  Mouth: Mucous membranes are moist.     Pharynx: Oropharynx is clear.  Eyes:     General: Lids are normal. Vision grossly intact.     Extraocular Movements: Extraocular movements intact.     Conjunctiva/sclera: Conjunctivae normal.     Pupils: Pupils are equal, round, and reactive to light.     Funduscopic exam:    Right eye: Red reflex present.        Left eye: Red reflex present.    Visual Fields: Right eye visual fields normal and left eye visual fields normal.  Neck:     Thyroid: No thyromegaly.     Vascular: No carotid bruit.  Cardiovascular:     Rate and Rhythm: Normal rate and regular rhythm.     Chest Wall: PMI is not displaced.     Pulses: Normal pulses.          Dorsalis pedis pulses are 2+ on the right side and 2+ on the left side.       Posterior tibial pulses are 2+ on the right side and 2+ on the left side.     Heart sounds: Normal heart sounds. No murmur heard. Pulmonary:     Effort: Pulmonary effort is normal. No respiratory distress.     Breath sounds: Normal breath sounds.  Abdominal:     General: Abdomen is flat. Bowel sounds are normal. There is no distension.     Palpations: Abdomen is soft. There is no hepatomegaly, splenomegaly or mass.     Tenderness: There is no abdominal tenderness. There is no right CVA tenderness, left CVA tenderness, guarding or rebound.  Musculoskeletal:        General: Normal range of motion.     Cervical back: Full passive range of motion without pain, normal range of motion and neck supple. No tenderness.     Right lower leg: No edema.     Left lower leg: No edema.  Feet:     Left foot:     Toenail Condition: Left toenails are normal.  Lymphadenopathy:     Cervical: No cervical adenopathy.     Upper Body:     Right upper body: No supraclavicular adenopathy.     Left upper body: No supraclavicular adenopathy.  Skin:    General: Skin is warm and dry.     Capillary Refill: Capillary refill  takes less than 2 seconds.     Nails: There is no clubbing.  Neurological:     General: No focal deficit present.     Mental Status: She is alert and oriented to person, place, and time.     GCS: GCS eye subscore is 4. GCS verbal subscore is 5. GCS motor subscore is 6.     Sensory: Sensation is intact.     Motor: Motor function is intact.     Coordination: Coordination is intact.     Gait: Gait is intact.     Deep Tendon Reflexes: Reflexes are normal and symmetric.  Psychiatric:        Attention and Perception: Attention normal.        Mood and Affect: Mood normal.        Speech: Speech normal.        Behavior: Behavior normal. Behavior is cooperative.        Thought Content: Thought content normal.        Cognition and Memory: Cognition and memory normal.        Judgment: Judgment normal.  Results for orders placed or performed in visit on 02/17/21  Novel Coronavirus, NAA ([facility])   Specimen: Nasopharyngeal(NP) swabs in vial transport medium  Result Value Ref Range   SARS-CoV-2, NAA Not Detected Not Detected  SARS-COV-2, NAA 2 DAY TAT  Result Value Ref Range   SARS-CoV-2, NAA 2 DAY TAT Performed   POCT rapid strep A  Result Value Ref Range   Rapid Strep A Screen Positive (A) Negative    IMMUNIZATIONS:   Flu: Flu vaccine completed elsewhere this season Prevnar 13: Prevnar 13 N/A for this patient Pneumovax 23: Pneumovax 23 N/A for this patient Vac Shingrix: Shingrix completed, documentation in chart (Dose # 2) HPV: HPV N/A for this patient Tetanus: Tetanus completed in the last 10 years  HEALTH MAINTENANCE: Pap Smear HM Status: is due and to be scheduled by patient for later completion  Mammogram HM Status: is up to date  Colon Cancer Screening HM Status: is up to date  Bone Density HM Status: is up to date  STI Testing HM Status: is not applicable for this patient due to low risk  Eye Exam HM Status: is up to date  Urine Micro HM Status: N/A  Spirometry HM  Status: N/A      Assessment & Plan:   Problem List Items Addressed This Visit     Persistent dry cough    Chronic. Etiology unknown at this time. She has tried and failed treatment for GERD and allergens, although all options have not been exhausted. Discussed option of change in oral allergy medication, use of nasal spray, such as azelastine, referral to allergy, referral to ENT, or different PPE. At this time joint decision was made to trial montelukast to see if symptoms are related to allergy/asthma exacerbation. She will monitor closely for changes and let me know if the medication does not seem effective.       Elevated LDL cholesterol level    Chronic. Will monitor labs today. Recommend dietary changes with low saturated fat and high fiber diet. Will make changes to plan of care as necessary based on labs.      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 No alarm sx present today. Labs pending for monitoring.       Encounter for annual physical exam - Primary    CPE today with no abnormalities noted on exam.  Labs pending. Will make changes as necessary based on results.  Review of HM activities and recommendations discussed and provided on AVS Anticipatory guidance, diet, and exercise recommendations provided.  Medications, allergies, and hx reviewed and updated as necessary.  Plan to f/u with CPE in 1 year or sooner for acute/chronic health needs as directed.        Relevant Orders   CBC with Differential/Platelet   Comprehensive metabolic panel   Lipid panel   VITAMIN D 25 Hydroxy (Vit-D Deficiency, Fractures)   TSH   Other Visit Diagnoses     Needs flu shot       Relevant Orders   Flu Vaccine QUAD 65moIM (Fluarix, Fluzone & Alfiuria Quad PF)   Chronic cough       Relevant Medications   montelukast (SINGULAIR) 10 MG tablet   Health care maintenance       Relevant Orders   CBC with Differential/Platelet   Comprehensive metabolic panel    Lipid panel   VITAMIN D 25 Hydroxy (Vit-D Deficiency, Fractures)   TSH  Follow up plan: Return in about 1 year (around 08/08/2023) for CPE.  NEXT PREVENTATIVE PHYSICAL DUE IN 1 YEAR.  PATIENT COUNSELING PROVIDED FOR ALL ADULT PATIENTS:  Consume a well balanced diet low in saturated fats, cholesterol, and moderation in carbohydrates.   This can be as simple as monitoring portion sizes and cutting back on sugary beverages such as soda and juice to start with.    Daily water consumption of at least 64 ounces.  Physical activity at least 180 minutes per week, if just starting out.   This can be as simple as taking the stairs instead of the elevator and walking 2-3 laps around the office  purposefully every day.   STD protection, partner selection, and regular testing if high risk.  Limited consumption of alcoholic beverages if alcohol is consumed.  For women, I recommend no more than 7 alcoholic beverages per week, spread out throughout the week.  Avoid "binge" drinking or consuming large quantities of alcohol in one setting.   Please let me know if you feel you may need help with reduction or quitting alcohol consumption.   Avoidance of nicotine, if used.  Please let me know if you feel you may need help with reduction or quitting nicotine use.   Daily mental health attention.  This can be in the form of 5 minute daily meditation, prayer, journaling, yoga, reflection, etc.   Purposeful attention to your emotions and mental state can significantly improve your overall wellbeing and Health.  Please know that I am here to help you with all of your health care goals and am happy to work with you to find a solution that works best for you.  The greatest advice I have received with any changes in life are to take it one step at a time, that even means if all you can focus on is the next 60 seconds, then do that and celebrate your victories.  With any changes in life, you  will have set backs, and that is OK. The important thing to remember is, if you have a set back, it is not a failure, it is an opportunity to try again!  Health Maintenance Recommendations Screening Testing Mammogram Every 1 -2 years based on history and risk factors Starting at age 11 Pap Smear Ages 21-39 every 3 years Ages 60-65 every 5 years with HPV testing More frequent testing may be required based on results and history Colon Cancer Screening Every 1-10 years based on test performed, risk factors, and history Starting at age 27 Bone Density Screening Every 2-10 years based on history Starting at age 19 for women Recommendations for men differ based on medication usage, history, and risk factors AAA Screening One time ultrasound Men 62-2 years old who have every smoked Lung Cancer Screening Low Dose Lung CT every 12 months Age 25-80 years with a 30 pack-year smoking history who still smoke or who have quit within the last 15 years  Screening Labs Routine  Labs: Complete Blood Count (CBC), Complete Metabolic Panel (CMP), Cholesterol (Lipid Panel) Every 6-12 months based on history and medications May be recommended more frequently based on current conditions or previous results Hemoglobin A1c Lab Every 3-12 months based on history and previous results Starting at age 24 or earlier with diagnosis of diabetes, high cholesterol, BMI >26, and/or risk factors Frequent monitoring for patients with diabetes to ensure blood sugar control Thyroid Panel (TSH w/ T3 & T4) Every 6 months based on history, symptoms, and risk  factors May be repeated more often if on medication HIV One time testing for all patients 43 and older May be repeated more frequently for patients with increased risk factors or exposure Hepatitis C One time testing for all patients 51 and older May be repeated more frequently for patients with increased risk factors or exposure Gonorrhea, Chlamydia Every 12  months for all sexually active persons 13-24 years Additional monitoring may be recommended for those who are considered high risk or who have symptoms PSA Men 51-35 years old with risk factors Additional screening may be recommended from age 65-69 based on risk factors, symptoms, and history  Vaccine Recommendations Tetanus Booster All adults every 10 years Flu Vaccine All patients 6 months and older every year COVID Vaccine All patients 12 years and older Initial dosing with booster May recommend additional booster based on age and health history HPV Vaccine 2 doses all patients age 24-26 Dosing may be considered for patients over 26 Shingles Vaccine (Shingrix) 2 doses all adults 74 years and older Pneumonia (Pneumovax 23) All adults 76 years and older May recommend earlier dosing based on health history Pneumonia (Prevnar 98) All adults 49 years and older Dosed 1 year after Pneumovax 23  Additional Screening, Testing, and Vaccinations may be recommended on an individualized basis based on family history, health history, risk factors, and/or exposure.

## 2022-08-07 NOTE — Assessment & Plan Note (Signed)
Chronic. No alarm sx present today. Labs pending for monitoring.

## 2022-08-08 LAB — COMPREHENSIVE METABOLIC PANEL
ALT: 13 IU/L (ref 0–32)
AST: 16 IU/L (ref 0–40)
Albumin/Globulin Ratio: 1.8 (ref 1.2–2.2)
Albumin: 4.4 g/dL (ref 3.9–4.9)
Alkaline Phosphatase: 75 IU/L (ref 44–121)
BUN/Creatinine Ratio: 12 (ref 12–28)
BUN: 11 mg/dL (ref 8–27)
Bilirubin Total: 0.5 mg/dL (ref 0.0–1.2)
CO2: 24 mmol/L (ref 20–29)
Calcium: 9.7 mg/dL (ref 8.7–10.3)
Chloride: 102 mmol/L (ref 96–106)
Creatinine, Ser: 0.89 mg/dL (ref 0.57–1.00)
Globulin, Total: 2.4 g/dL (ref 1.5–4.5)
Glucose: 98 mg/dL (ref 70–99)
Potassium: 4.4 mmol/L (ref 3.5–5.2)
Sodium: 141 mmol/L (ref 134–144)
Total Protein: 6.8 g/dL (ref 6.0–8.5)
eGFR: 74 mL/min/{1.73_m2} (ref >59)

## 2022-08-08 LAB — CBC WITH DIFFERENTIAL/PLATELET
Basophils Absolute: 0 10*3/uL (ref 0.0–0.2)
Basos: 1 %
EOS (ABSOLUTE): 0.1 10*3/uL (ref 0.0–0.4)
Eos: 1 %
Hematocrit: 43.3 % (ref 34.0–46.6)
Hemoglobin: 15.1 g/dL (ref 11.1–15.9)
Immature Grans (Abs): 0 10*3/uL (ref 0.0–0.1)
Immature Granulocytes: 0 %
Lymphocytes Absolute: 2.1 10*3/uL (ref 0.7–3.1)
Lymphs: 30 %
MCH: 31.9 pg (ref 26.6–33.0)
MCHC: 34.9 g/dL (ref 31.5–35.7)
MCV: 92 fL (ref 79–97)
Monocytes Absolute: 0.4 10*3/uL (ref 0.1–0.9)
Monocytes: 6 %
Neutrophils Absolute: 4.3 10*3/uL (ref 1.4–7.0)
Neutrophils: 62 %
Platelets: 257 10*3/uL (ref 150–450)
RBC: 4.73 x10E6/uL (ref 3.77–5.28)
RDW: 11.5 % — ABNORMAL LOW (ref 11.7–15.4)
WBC: 6.9 10*3/uL (ref 3.4–10.8)

## 2022-08-08 LAB — LIPID PANEL
Chol/HDL Ratio: 3.7 ratio (ref 0.0–4.4)
Cholesterol, Total: 184 mg/dL (ref 100–199)
HDL: 50 mg/dL (ref >39)
LDL Chol Calc (NIH): 120 mg/dL — ABNORMAL HIGH (ref 0–99)
Triglycerides: 73 mg/dL (ref 0–149)
VLDL Cholesterol Cal: 14 mg/dL (ref 5–40)

## 2022-08-08 LAB — VITAMIN D 25 HYDROXY (VIT D DEFICIENCY, FRACTURES): Vit D, 25-Hydroxy: 20.9 ng/mL — ABNORMAL LOW (ref 30.0–100.0)

## 2022-08-08 LAB — TSH: TSH: 2.35 u[IU]/mL (ref 0.450–4.500)

## 2022-08-13 ENCOUNTER — Encounter: Payer: BC Managed Care – PPO | Admitting: Physician Assistant

## 2022-08-24 MED ORDER — VITAMIN D (ERGOCALCIFEROL) 1.25 MG (50000 UNIT) PO CAPS: 50000.0000 [IU] | ORAL_CAPSULE | ORAL | 0 refills | Status: DC

## 2022-08-24 NOTE — Addendum Note (Signed)
Addended by: Raygen Dahm, Clarise Cruz E on: 08/24/2022 06:43 PM   Modules accepted: Orders

## 2022-10-05 ENCOUNTER — Ambulatory Visit: Payer: BC Managed Care – PPO | Admitting: Medical

## 2022-10-05 ENCOUNTER — Encounter: Payer: Self-pay | Admitting: Medical

## 2022-10-05 VITALS — BP 118/70 | HR 68 | Temp 98.5°F | Ht 64.75 in | Wt 143.6 lb

## 2022-10-05 DIAGNOSIS — N952 Postmenopausal atrophic vaginitis: Secondary | ICD-10-CM

## 2022-10-05 DIAGNOSIS — R350 Frequency of micturition: Secondary | ICD-10-CM | POA: Diagnosis not present

## 2022-10-05 DIAGNOSIS — R300 Dysuria: Secondary | ICD-10-CM

## 2022-10-05 LAB — POCT URINALYSIS DIP (PROADVANTAGE DEVICE)
Bilirubin, UA: NEGATIVE
Glucose, UA: NEGATIVE mg/dL
Ketones, POC UA: NEGATIVE mg/dL
Leukocytes, UA: NEGATIVE
Nitrite, UA: NEGATIVE
Protein Ur, POC: NEGATIVE mg/dL
Specific Gravity, Urine: 1.015
Urobilinogen, Ur: 0.2
pH, UA: 6.5 (ref 5.0–8.0)

## 2022-10-05 MED ORDER — SULFAMETHOXAZOLE-TRIMETHOPRIM 800-160 MG PO TABS: 1.0000 | ORAL_TABLET | Freq: Two times a day (BID) | ORAL | 0 refills | Status: DC

## 2022-10-05 NOTE — Progress Notes (Signed)
Subjective:  Sabrina Ho is a 62 y.o. female who presents for Chief Complaint  Patient presents with   Urinary Frequency    Started with urinary pressure last Sunday, felt better and then started again yesterday. Makes her feel like she has to go more often.      Here for possible UTI.  Since this past Sunday 5 days ago started having urinary pressure.  Has lingered but worse this morning.  No blood in urine, no odor, no cloudy urine.  Having some urgency.  Some frequency.  No mid back pain.  No fever.    Last UTI several years ago.    No vaginal discharge.   Married.  No concern for STD.  Water intake is not good.   No other aggravating or relieving factors.    No other c/o.  The following portions of the patient's history were reviewed and updated as appropriate: allergies, current medications, past family history, past medical history, past social history, past surgical history and problem list.  ROS Otherwise as in subjective above    Objective: BP 118/70   Pulse 68   Temp 98.5 F (36.9 C) (Tympanic)   Ht 5' 4.75" (1.645 m)   Wt 143 lb 9.6 oz (65.1 kg)   BMI 24.08 kg/m   General appearance: alert, no distress, well developed, well nourished Abdomen: +bs, soft, non tender, non distended, no masses, no hepatomegaly, no splenomegaly Back: nontender, no CVA tenderness   Assessment: Encounter Diagnoses  Name Primary?   Urinary frequency Yes   Dysuria    Atrophic vaginitis      Plan: Urine dipstick unremarkable.   Culture sent.  If symptoms worsen over weekend, begin antibiotic, otherwise await culture.     She has hx/o atrophic changes.  Consider f/u with gynecology to re-examine and consider hormonal cream for atrophic changes.  Alois was seen today for urinary frequency.  Diagnoses and all orders for this visit:  Urinary frequency -     POCT Urinalysis DIP (Proadvantage Device) -     Urine Culture  Dysuria -     Urine Culture  Atrophic  vaginitis  Other orders -     sulfamethoxazole-trimethoprim (BACTRIM DS) 800-160 MG tablet; Take 1 tablet by mouth 2 (two) times daily.    Follow up: pending culture

## 2022-10-07 LAB — URINE CULTURE

## 2022-10-07 NOTE — Progress Notes (Signed)
The urine culture was negative for infection.  If still having symptoms, cause is something besides urinary infection.   Consider follow up with our office or gynecology for other eval and treatment recommendations.   How are your symptom currently?

## 2022-10-24 DIAGNOSIS — Z780 Asymptomatic menopausal state: Secondary | ICD-10-CM | POA: Diagnosis not present

## 2022-10-24 DIAGNOSIS — Z01419 Encounter for gynecological examination (general) (routine) without abnormal findings: Secondary | ICD-10-CM | POA: Diagnosis not present

## 2022-10-24 DIAGNOSIS — Z130 Encounter for screening for diseases of the blood and blood-forming organs and certain disorders involving the immune mechanism: Secondary | ICD-10-CM | POA: Diagnosis not present

## 2022-10-24 DIAGNOSIS — N3946 Mixed incontinence: Secondary | ICD-10-CM | POA: Diagnosis not present

## 2022-10-26 ENCOUNTER — Other Ambulatory Visit: Payer: Self-pay | Admitting: Gynecology

## 2022-10-26 DIAGNOSIS — Z13820 Encounter for screening for osteoporosis: Secondary | ICD-10-CM

## 2022-12-12 ENCOUNTER — Other Ambulatory Visit: Payer: Self-pay | Admitting: Nurse Practitioner

## 2022-12-12 DIAGNOSIS — Z1231 Encounter for screening mammogram for malignant neoplasm of breast: Secondary | ICD-10-CM

## 2023-02-04 ENCOUNTER — Ambulatory Visit
Admission: RE | Admit: 2023-02-04 | Discharge: 2023-02-04 | Disposition: A | Discharge status: Home / Self Care | Payer: BC Managed Care – PPO | Source: Ambulatory Visit | Attending: Nurse Practitioner | Admitting: Nurse Practitioner

## 2023-02-04 DIAGNOSIS — Z1231 Encounter for screening mammogram for malignant neoplasm of breast: Secondary | ICD-10-CM | POA: Diagnosis not present

## 2023-02-07 ENCOUNTER — Other Ambulatory Visit: Payer: Self-pay | Admitting: Nurse Practitioner

## 2023-02-07 DIAGNOSIS — R928 Other abnormal and inconclusive findings on diagnostic imaging of breast: Secondary | ICD-10-CM

## 2023-02-13 ENCOUNTER — Ambulatory Visit
Admission: RE | Admit: 2023-02-13 | Discharge: 2023-02-13 | Disposition: A | Discharge status: Home / Self Care | Payer: BC Managed Care – PPO | Source: Ambulatory Visit | Attending: Nurse Practitioner | Admitting: Nurse Practitioner

## 2023-02-13 DIAGNOSIS — R922 Inconclusive mammogram: Secondary | ICD-10-CM | POA: Diagnosis not present

## 2023-02-13 DIAGNOSIS — R92331 Mammographic heterogeneous density, right breast: Secondary | ICD-10-CM | POA: Diagnosis not present

## 2023-02-13 DIAGNOSIS — R928 Other abnormal and inconclusive findings on diagnostic imaging of breast: Secondary | ICD-10-CM

## 2023-03-10 DIAGNOSIS — R300 Dysuria: Secondary | ICD-10-CM | POA: Diagnosis not present

## 2023-03-10 DIAGNOSIS — N390 Urinary tract infection, site not specified: Secondary | ICD-10-CM | POA: Diagnosis not present

## 2023-03-19 DIAGNOSIS — H52203 Unspecified astigmatism, bilateral: Secondary | ICD-10-CM | POA: Diagnosis not present

## 2023-03-19 DIAGNOSIS — H2513 Age-related nuclear cataract, bilateral: Secondary | ICD-10-CM | POA: Diagnosis not present

## 2023-04-15 ENCOUNTER — Ambulatory Visit
Admission: RE | Admit: 2023-04-15 | Discharge: 2023-04-15 | Disposition: A | Discharge status: Home / Self Care | Payer: BC Managed Care – PPO | Source: Ambulatory Visit | Attending: Gynecology | Admitting: Gynecology

## 2023-04-15 DIAGNOSIS — N958 Other specified menopausal and perimenopausal disorders: Secondary | ICD-10-CM | POA: Diagnosis not present

## 2023-04-15 DIAGNOSIS — E349 Endocrine disorder, unspecified: Secondary | ICD-10-CM | POA: Diagnosis not present

## 2023-04-15 DIAGNOSIS — Z13820 Encounter for screening for osteoporosis: Secondary | ICD-10-CM

## 2023-04-15 DIAGNOSIS — M8588 Other specified disorders of bone density and structure, other site: Secondary | ICD-10-CM | POA: Diagnosis not present

## 2023-05-29 ENCOUNTER — Ambulatory Visit: Payer: BC Managed Care – PPO | Admitting: Medical

## 2023-05-29 VITALS — BP 110/70 | HR 67 | Temp 97.7°F | Wt 139.0 lb

## 2023-05-29 DIAGNOSIS — R300 Dysuria: Secondary | ICD-10-CM | POA: Diagnosis not present

## 2023-05-29 LAB — POCT URINALYSIS DIP (PROADVANTAGE DEVICE)
Bilirubin, UA: NEGATIVE
Glucose, UA: NEGATIVE mg/dL
Ketones, POC UA: NEGATIVE mg/dL
Nitrite, UA: NEGATIVE
Protein Ur, POC: NEGATIVE mg/dL
Specific Gravity, Urine: 1.01
Urobilinogen, Ur: NEGATIVE
pH, UA: 6 (ref 5.0–8.0)

## 2023-05-29 MED ORDER — NITROFURANTOIN MONOHYD MACRO 100 MG PO CAPS: 100.0000 mg | ORAL_CAPSULE | Freq: Two times a day (BID) | ORAL | 0 refills | Status: AC

## 2023-05-29 NOTE — Progress Notes (Signed)
Confirm Sabrina Ho has sample to send for culture

## 2023-05-29 NOTE — Progress Notes (Signed)
Subjective:  Sabrina Ho is a 62 y.o. female who presents for Chief Complaint  Patient presents with   Urinary Tract Infection    Burning UTI, pressure. Took azo and felt better for a couple days and then hit her again with burning and pressure. Going out of town this weekend      Here for possible urinary tract infection.  She has a few days of urinary pressure, burning and urgency.   no fever, no blood in urine no body aches or chills.  No abdominal or back pain.  No vaginal discharge.  She came in in January for the same and urine culture ended up not showing much.  She notes that she had 2 other visits to urgent care since then and 1 of those did have a positive urine culture.  No other aggravating or relieving factors.    No other c/o.  Past Medical History:  Diagnosis Date   Hemochromatosis    Current Outpatient Medications on File Prior to Visit  Medication Sig Dispense Refill   montelukast (SINGULAIR) 10 MG tablet Take 1 tablet (10 mg total) by mouth at bedtime. 30 tablet 3   Multiple Vitamin (MULTIVITAMIN) tablet Take 1 tablet by mouth daily.     No current facility-administered medications on file prior to visit.     The following portions of the patient's history were reviewed and updated as appropriate: allergies, current medications, past family history, past medical history, past social history, past surgical history and problem list.  ROS Otherwise as in subjective above  Objective: BP 110/70   Pulse 67   Temp 97.7 F (36.5 C)   Wt 139 lb (63 kg)   BMI 23.31 kg/m   General appearance: alert, no distress, well developed, well nourished Abdomen: +bs, soft, non tender, non distended, no masses, no hepatomegaly, no splenomegaly Back: no CVA tenderness    Assessment: Encounter Diagnosis  Name Primary?   Dysuria Yes     Plan: We discussed symptoms and concerns.  Her January 2024 urine culture did not show infection when she came in for similar  symptoms.  We discussed other differential.  She declines pelvic exam or vaginal swabs today.  We discussed the potential for atrophic vaginitis changes.  Pending culture we may end up recommend she go back to see gynecology to discuss possibly using Premarin cream for atrophic changes or if positive culture we may send to urology for further evaluation  Macrobid sent for empiric therapy if her symptoms worsen over the next few days or culture comes back positive.  However we will use a watch and wait approach for now  Sabrina "Angie" was seen today for urinary tract infection.  Diagnoses and all orders for this visit:  Dysuria -     Urine Culture -     POCT Urinalysis DIP (Proadvantage Device)  Other orders -     nitrofurantoin, macrocrystal-monohydrate, (MACROBID) 100 MG capsule; Take 1 capsule (100 mg total) by mouth 2 (two) times daily.    Follow up: pending culture

## 2023-05-31 LAB — URINE CULTURE

## 2023-05-31 NOTE — Progress Notes (Signed)
Results sent through MyChart

## 2023-08-08 DIAGNOSIS — D692 Other nonthrombocytopenic purpura: Secondary | ICD-10-CM | POA: Diagnosis not present

## 2023-08-08 DIAGNOSIS — Z85828 Personal history of other malignant neoplasm of skin: Secondary | ICD-10-CM | POA: Diagnosis not present

## 2023-08-08 DIAGNOSIS — L814 Other melanin hyperpigmentation: Secondary | ICD-10-CM | POA: Diagnosis not present

## 2023-08-08 DIAGNOSIS — L820 Inflamed seborrheic keratosis: Secondary | ICD-10-CM | POA: Diagnosis not present

## 2023-08-08 DIAGNOSIS — D485 Neoplasm of uncertain behavior of skin: Secondary | ICD-10-CM | POA: Diagnosis not present

## 2023-08-08 DIAGNOSIS — L821 Other seborrheic keratosis: Secondary | ICD-10-CM | POA: Diagnosis not present

## 2023-11-19 DIAGNOSIS — Z130 Encounter for screening for diseases of the blood and blood-forming organs and certain disorders involving the immune mechanism: Secondary | ICD-10-CM | POA: Diagnosis not present

## 2023-11-19 DIAGNOSIS — R300 Dysuria: Secondary | ICD-10-CM | POA: Diagnosis not present

## 2023-11-19 DIAGNOSIS — Z01411 Encounter for gynecological examination (general) (routine) with abnormal findings: Secondary | ICD-10-CM | POA: Diagnosis not present

## 2023-11-19 DIAGNOSIS — R3129 Other microscopic hematuria: Secondary | ICD-10-CM | POA: Diagnosis not present

## 2023-12-23 ENCOUNTER — Other Ambulatory Visit: Payer: Self-pay | Admitting: Gynecology

## 2023-12-23 DIAGNOSIS — Z1231 Encounter for screening mammogram for malignant neoplasm of breast: Secondary | ICD-10-CM

## 2024-02-06 ENCOUNTER — Ambulatory Visit

## 2024-02-17 ENCOUNTER — Ambulatory Visit
Admission: RE | Admit: 2024-02-17 | Discharge: 2024-02-17 | Disposition: A | Discharge status: Home / Self Care | Source: Ambulatory Visit | Attending: Gynecology | Admitting: Gynecology

## 2024-02-17 DIAGNOSIS — Z1231 Encounter for screening mammogram for malignant neoplasm of breast: Secondary | ICD-10-CM

## 2024-03-24 DIAGNOSIS — H5213 Myopia, bilateral: Secondary | ICD-10-CM | POA: Diagnosis not present

## 2024-03-24 DIAGNOSIS — H25813 Combined forms of age-related cataract, bilateral: Secondary | ICD-10-CM | POA: Diagnosis not present

## 2024-08-10 DIAGNOSIS — D692 Other nonthrombocytopenic purpura: Secondary | ICD-10-CM | POA: Diagnosis not present

## 2024-08-10 DIAGNOSIS — D220 Melanocytic nevi of lip: Secondary | ICD-10-CM | POA: Diagnosis not present

## 2024-08-10 DIAGNOSIS — D225 Melanocytic nevi of trunk: Secondary | ICD-10-CM | POA: Diagnosis not present

## 2024-08-10 DIAGNOSIS — Z85828 Personal history of other malignant neoplasm of skin: Secondary | ICD-10-CM | POA: Diagnosis not present
